# Patient Record
Sex: Male | Born: 1970 | Race: Black or African American | Hispanic: No | Marital: Single | Smoking: Current every day smoker
Health system: Southern US, Community
[De-identification: ages and names within clinical notes are randomized; demographics above are authoritative.]

## PROBLEM LIST (undated history)

## (undated) DIAGNOSIS — E119 Type 2 diabetes mellitus without complications: Secondary | ICD-10-CM

## (undated) DIAGNOSIS — K5792 Diverticulitis of intestine, part unspecified, without perforation or abscess without bleeding: Secondary | ICD-10-CM

## (undated) DIAGNOSIS — A6000 Herpesviral infection of urogenital system, unspecified: Secondary | ICD-10-CM

---

## 2013-02-02 ENCOUNTER — Encounter (HOSPITAL_COMMUNITY): Payer: Self-pay | Admitting: Emergency Medicine

## 2013-02-02 ENCOUNTER — Emergency Department (HOSPITAL_COMMUNITY)
Admission: EM | Admit: 2013-02-02 | Discharge: 2013-02-02 | Disposition: A | Discharge status: Home Health | Payer: Self-pay | Attending: Emergency Medicine | Admitting: Emergency Medicine

## 2013-02-02 ENCOUNTER — Emergency Department (HOSPITAL_COMMUNITY): Payer: Self-pay

## 2013-02-02 DIAGNOSIS — J3489 Other specified disorders of nose and nasal sinuses: Secondary | ICD-10-CM | POA: Insufficient documentation

## 2013-02-02 DIAGNOSIS — R05 Cough: Secondary | ICD-10-CM | POA: Insufficient documentation

## 2013-02-02 DIAGNOSIS — J019 Acute sinusitis, unspecified: Secondary | ICD-10-CM | POA: Insufficient documentation

## 2013-02-02 DIAGNOSIS — F17200 Nicotine dependence, unspecified, uncomplicated: Secondary | ICD-10-CM | POA: Insufficient documentation

## 2013-02-02 DIAGNOSIS — R059 Cough, unspecified: Secondary | ICD-10-CM | POA: Insufficient documentation

## 2013-02-02 MED ORDER — AZITHROMYCIN 250 MG PO TABS: 250.0000 mg | ORAL_TABLET | Freq: Every day | ORAL | Status: DC

## 2013-02-02 MED ORDER — HYDROCODONE-HOMATROPINE 5-1.5 MG/5ML PO SYRP: 5.0000 mL | ORAL_SOLUTION | Freq: Four times a day (QID) | ORAL | Status: DC | PRN

## 2013-02-02 NOTE — ED Notes (Signed)
Pt discharged home with family. NAD.

## 2013-02-02 NOTE — ED Provider Notes (Signed)
History     CSN: [id number]  Arrival date & time 02/02/13  0702   First MD Initiated Contact with Patient 02/02/13 0708      No chief complaint on file.   (Consider location/radiation/quality/duration/timing/severity/associated sxs/prior treatment) HPI Comments: Patient presents today with a chief complaint of productive cough, nasal congestion, sinus pressure, and mild shortness of breath.  He reports that his symptoms have been present for the past 3 weeks and are gradually worsening.  He denies any chest pain.  Denies wheezing.  Denies hemoptysis.  He has not taken his temperature, but does not think that he has been running a fever.  He has not taken anything for symptoms prior to arrival.  He currently smokes 1 cigarette daily.  No history of COPD or Asthma.  Denies prolonged travel or surgeries in the past 4 weeks.  No history of DVT or PE.  No swelling of the lower extremities.    The history is provided by the patient.    No past medical history on file.  No past surgical history on file.  No family history on file.  History  Substance Use Topics  . Smoking status: Not on file  . Smokeless tobacco: Not on file  . Alcohol Use: Not on file      Review of Systems  Constitutional: Negative for fever and chills.  HENT: Positive for congestion, rhinorrhea and sinus pressure.   Respiratory: Positive for cough and shortness of breath.   Skin: Negative for rash.  All other systems reviewed and are negative.    Allergies  Review of patient's allergies indicates not on file.  Home Medications  No current outpatient prescriptions on file.  BP 140/92  Pulse 92  Temp(Src) 99.5 F (37.5 C) (Oral)  Resp 22  SpO2 100%  Physical Exam  Nursing note and vitals reviewed. Constitutional: He appears well-developed and well-nourished. No distress.  HENT:  Head: Normocephalic and atraumatic.  Right Ear: Tympanic membrane and ear canal normal.  Left Ear: Tympanic membrane  and ear canal normal.  Nose: Mucosal edema present. Right sinus exhibits maxillary sinus tenderness. Right sinus exhibits no frontal sinus tenderness. Left sinus exhibits maxillary sinus tenderness. Left sinus exhibits no frontal sinus tenderness.  Mouth/Throat: Uvula is midline, oropharynx is clear and moist and mucous membranes are normal.  Neck: Normal range of motion. Neck supple.  Cardiovascular: Normal rate, regular rhythm and normal heart sounds.   Pulmonary/Chest: Effort normal and breath sounds normal. No respiratory distress. He has no wheezes. He has no rales.  Neurological: He is alert.  Skin: Skin is warm and dry. He is not diaphoretic.  Psychiatric: He has a normal mood and affect.    ED Course  Procedures (including critical care time)  Labs Reviewed - No data to display Dg Chest 2 View  02/02/2013  *RADIOLOGY REPORT*  Clinical Data: Cough, fever and shortness of breath.  CHEST - 2 VIEW  Comparison:  None.  Findings:  The heart size and mediastinal contours are within normal limits.  Both lungs are clear.  The visualized skeletal structures are unremarkable.  IMPRESSION: No active cardiopulmonary disease.   Original Report Authenticated By: Richarda Overlie, M.D.      No diagnosis found.    MDM  Patient presenting with cough, nasal congestion, sinus pressure, and mild SOB that has been present for the past 3 weeks.  CXR negative.  Patient did have sinus pressure to palpation.  Patient discharged home with antibiotic.   No  respiratory distress.  Patient stable for discharge.  Return precautions discussed.        Pascal Lux Ladue, PA-C 02/02/13 2227

## 2013-02-02 NOTE — ED Notes (Signed)
Pt reports shortness of breath for 3 weeks. NAD.

## 2013-02-03 NOTE — ED Provider Notes (Signed)
Medical screening examination/treatment/procedure(s) were performed by non-physician practitioner and as supervising physician I was immediately available for consultation/collaboration.  Gilda Crease, MD 02/03/13 1330

## 2013-07-31 ENCOUNTER — Encounter (HOSPITAL_COMMUNITY): Payer: Self-pay | Admitting: Emergency Medicine

## 2013-07-31 ENCOUNTER — Emergency Department (HOSPITAL_COMMUNITY): Payer: Self-pay

## 2013-07-31 ENCOUNTER — Emergency Department (HOSPITAL_COMMUNITY)
Admission: EM | Admit: 2013-07-31 | Discharge: 2013-07-31 | Disposition: A | Discharge status: Home / Self Care | Payer: Self-pay | Attending: Emergency Medicine | Admitting: Emergency Medicine

## 2013-07-31 DIAGNOSIS — R209 Unspecified disturbances of skin sensation: Secondary | ICD-10-CM | POA: Insufficient documentation

## 2013-07-31 DIAGNOSIS — I10 Essential (primary) hypertension: Secondary | ICD-10-CM | POA: Insufficient documentation

## 2013-07-31 DIAGNOSIS — R51 Headache: Secondary | ICD-10-CM | POA: Insufficient documentation

## 2013-07-31 DIAGNOSIS — F17200 Nicotine dependence, unspecified, uncomplicated: Secondary | ICD-10-CM | POA: Insufficient documentation

## 2013-07-31 DIAGNOSIS — R200 Anesthesia of skin: Secondary | ICD-10-CM

## 2013-07-31 DIAGNOSIS — Z8619 Personal history of other infectious and parasitic diseases: Secondary | ICD-10-CM | POA: Insufficient documentation

## 2013-07-31 HISTORY — DX: Herpesviral infection of urogenital system, unspecified: A60.00

## 2013-07-31 LAB — CBC
MCH: 31.3 pg (ref 26.0–34.0)
MCHC: 36.9 g/dL — ABNORMAL HIGH (ref 30.0–36.0)
Platelets: 239 10*3/uL (ref 150–400)

## 2013-07-31 LAB — COMPREHENSIVE METABOLIC PANEL
ALT: 36 U/L (ref 0–53)
AST: 26 U/L (ref 0–37)
Albumin: 3.9 g/dL (ref 3.5–5.2)
Alkaline Phosphatase: 81 U/L (ref 39–117)
BUN: 14 mg/dL (ref 6–23)
Potassium: 3.9 mEq/L (ref 3.5–5.1)
Sodium: 134 mEq/L — ABNORMAL LOW (ref 135–145)
Total Protein: 7.5 g/dL (ref 6.0–8.3)

## 2013-07-31 LAB — DIFFERENTIAL
Basophils Absolute: 0 10*3/uL (ref 0.0–0.1)
Basophils Relative: 0 % (ref 0–1)
Eosinophils Absolute: 0.1 10*3/uL (ref 0.0–0.7)
Neutrophils Relative %: 61 % (ref 43–77)

## 2013-07-31 LAB — GLUCOSE, CAPILLARY

## 2013-07-31 LAB — PROTIME-INR
INR: 1.01 (ref 0.00–1.49)
Prothrombin Time: 13.1 seconds (ref 11.6–15.2)

## 2013-07-31 LAB — APTT: aPTT: 27 seconds (ref 24–37)

## 2013-07-31 LAB — TROPONIN I: Troponin I: <0.3 ng/mL (ref <0.30)

## 2013-07-31 LAB — POCT I-STAT TROPONIN I: Troponin i, poc: 0 ng/mL (ref 0.00–0.08)

## 2013-07-31 NOTE — ED Notes (Signed)
Reports numbness to left mouth almost gone

## 2013-07-31 NOTE — ED Provider Notes (Signed)
CSN: [id number]     Arrival date & time 07/31/13  1253 History   First MD Initiated Contact with Patient 07/31/13 1314     Chief Complaint  Patient presents with  . Numbness    left upper lip numb   (Consider location/radiation/quality/duration/timing/severity/associated sxs/prior Treatment) HPI Comments: Patient is a 42 year old male past medical history significant for recent herpes diagnosis, HTN presented to the emergency department for left-sided upper lip numbness that began around noon today. Patient states he had a short-lived episode of sharp pain to the left forearm that has resolved and not returned. Patient reports mild generalized headache that began during stay in the emergency department. Patient denies any chest pain, shortness of breath, diaphoresis, nausea, vomiting, numbness or weakness in his extremities, visual disturbance  The history is provided by the patient.    Past Medical History  Diagnosis Date  . Genital herpes   . Hypertension    History reviewed. No pertinent past surgical history. No family history on file. History  Substance Use Topics  . Smoking status: Current Every Day Smoker  . Smokeless tobacco: Not on file  . Alcohol Use: No    Review of Systems  Constitutional: Negative for fever.  HENT: Negative for dental problem, sore throat and trouble swallowing.   Eyes: Negative.   Respiratory: Negative for shortness of breath.   Cardiovascular: Negative for chest pain.  Gastrointestinal: Negative for nausea, vomiting and abdominal pain.  Genitourinary: Negative.   Musculoskeletal: Negative.   Skin: Negative.   Neurological: Negative for weakness.    Allergies  Review of patient's allergies indicates no known allergies.  Home Medications   Current Outpatient Rx  Name  Route  Sig  Dispense  Refill  . fluconazole (DIFLUCAN) 150 MG tablet   Oral   Take 150 mg by mouth See admin instructions. Take 1 tablet on day 1, day 4, and day 7 -  starting on 07/29/13         . valACYclovir (VALTREX) 1000 MG tablet   Oral   Take 1,000 mg by mouth 2 (two) times daily. Started on 07/28/13 for 7 days          BP 132/91  Pulse 85  Temp(Src) 98.1 F (36.7 C) (Oral)  Resp 18  Ht 5' 8.5" (1.74 m)  Wt 230 lb (104.327 kg)  BMI 34.46 kg/m2  SpO2 100% Physical Exam  Constitutional: He is oriented to person, place, and time. He appears well-developed and well-nourished.  HENT:  Head: Normocephalic and atraumatic.  Right Ear: External ear normal.  Left Ear: External ear normal.  Nose: Nose normal.  Mouth/Throat: Oropharynx is clear and moist.  Eyes: Conjunctivae and EOM are normal. Pupils are equal, round, and reactive to light.  Neck: Neck supple.  Cardiovascular: Normal rate, regular rhythm, normal heart sounds and intact distal pulses.   Pulmonary/Chest: Effort normal and breath sounds normal. No respiratory distress.  Abdominal: Soft. Bowel sounds are normal. He exhibits no distension. There is no tenderness. There is no rebound and no guarding.  Musculoskeletal: Normal range of motion. He exhibits no edema.  Neurological: He is alert and oriented to person, place, and time. He has normal strength. No cranial nerve deficit or sensory deficit. He displays a negative Romberg sign. Gait normal. GCS eye subscore is 4. GCS verbal subscore is 5. GCS motor subscore is 6.  No pronator drift. Bilateral heel-knee-shin intact. Dull and sharp sensation intact.   Skin: Skin is warm and dry.  Psychiatric:  He has a normal mood and affect.    ED Course  Procedures (including critical care time) Labs Review Labs Reviewed  CBC - Abnormal; Notable for the following:    WBC 12.4 (*)    MCHC 36.9 (*)    All other components within normal limits  DIFFERENTIAL - Abnormal; Notable for the following:    Lymphs Abs 4.1 (*)    All other components within normal limits  COMPREHENSIVE METABOLIC PANEL - Abnormal; Notable for the following:     Sodium 134 (*)    Glucose, Bld 226 (*)    All other components within normal limits  GLUCOSE, CAPILLARY - Abnormal; Notable for the following:    Glucose-Capillary 216 (*)    All other components within normal limits  PROTIME-INR  APTT  TROPONIN I  POCT I-STAT TROPONIN I   Imaging Review Ct Head (brain) Wo Contrast  07/31/2013   CLINICAL DATA:  42 year old male with headache, left face and lip numbness.  EXAM: CT HEAD WITHOUT CONTRAST  TECHNIQUE: Contiguous axial images were obtained from the base of the skull through the vertex without intravenous contrast.  COMPARISON:  None.  FINDINGS: Visualized paranasal sinuses and mastoids are clear. No acute osseous abnormality identified. Visualized orbits and scalp soft tissues are within normal limits.  Normal cerebral volume. No midline shift, ventriculomegaly, mass effect, evidence of mass lesion, intracranial hemorrhage or evidence of cortically based acute infarction. Gray-white matter differentiation is within normal limits throughout the brain. No suspicious intracranial vascular hyperdensity.  IMPRESSION: Normal noncontrast CT appearance of the brain.   Electronically Signed   By: Augusto Gamble M.D.   On: 07/31/2013 15:03     Date: 07/31/2013  Rate: 93  Rhythm: normal sinus rhythm  QRS Axis: normal  Intervals: normal  ST/T Wave abnormalities: normal  Conduction Disutrbances:none  Narrative Interpretation:   Old EKG Reviewed: none available    MDM   1. Numbness of lip     Afebrile, NAD, non-toxic appearing, AAOx4. No neurofocal deficits on examination. No sensory deficits, sharp and dull sensation intact. I have reviewed nursing notes, vital signs, and all appropriate lab and imaging results for this patient. No concern for intracrnial or cardiac process. CT scan w/o abnormality. EKG w/o abnormality. Labs unremarkable. Symptoms resolved in ED. Return precautions discussed. Patient is agreeable to plan. Patient is stable at time of  discharge. Patient d/w with Dr. Loretha Stapler, agrees with plan.           Jeannetta Ellis, PA-C 08/01/13 1641

## 2013-07-31 NOTE — ED Notes (Signed)
Returned from CT scan.

## 2013-07-31 NOTE — ED Notes (Addendum)
C/o sudden onset at 1200 left corner of mouth numbness which continues. States had sharp pains to left forearm at same time but lasted only few minutes. Denies LUE pain presently.  C/o intermittent h/a x 3 days, presently 6/10. Denies n/v. Pt drove self here & ambulatory to room from triage.  Also reports diagnosed Monday with genital herpes & started on fluconazole & valcyclovir. And was told probably has adult onset diabetes.

## 2013-07-31 NOTE — ED Notes (Signed)
Pt reporting left upper lip is numb starting 45 mins ago, also had pain to left arm. Arm pain has subsided. Denies cp, sob or diaphoresis. Pt is a x 4. Speech is clear and concise. Reports he was seen at [facility] this week and told his blood sugar was high. No acute neuro deficits.

## 2013-08-02 NOTE — ED Provider Notes (Signed)
Medical screening examination/treatment/procedure(s) were conducted as a shared visit with non-physician practitioner(s) and myself.  I personally evaluated the patient during the encounter  42 yo male presenting with CC of left upper lip numbness.  At time of my exam, symptom had resolved.  By my exam and by PA Piepenbrink's exam, he had no sensory deficits (or other neurologic deficits.)  Don't think story is consistent with TIA.  Plan dc home with return precautions and PCP follow up.    Clinical Impression: 1. Numbness of lip       Candyce Churn, MD 08/02/13 [phone]

## 2013-08-02 NOTE — ED Provider Notes (Signed)
Medical screening examination/treatment/procedure(s) were conducted as a shared visit with non-physician practitioner(s) and myself.  I personally evaluated the patient during the encounter.   Please see my separate note.     Candyce Churn, MD 08/02/13 [phone]

## 2013-09-14 ENCOUNTER — Encounter (HOSPITAL_COMMUNITY): Payer: Self-pay | Admitting: Emergency Medicine

## 2013-09-14 DIAGNOSIS — Z79899 Other long term (current) drug therapy: Secondary | ICD-10-CM | POA: Insufficient documentation

## 2013-09-14 DIAGNOSIS — Z8619 Personal history of other infectious and parasitic diseases: Secondary | ICD-10-CM | POA: Insufficient documentation

## 2013-09-14 DIAGNOSIS — Z792 Long term (current) use of antibiotics: Secondary | ICD-10-CM | POA: Insufficient documentation

## 2013-09-14 DIAGNOSIS — E119 Type 2 diabetes mellitus without complications: Secondary | ICD-10-CM | POA: Insufficient documentation

## 2013-09-14 DIAGNOSIS — K5732 Diverticulitis of large intestine without perforation or abscess without bleeding: Secondary | ICD-10-CM | POA: Insufficient documentation

## 2013-09-14 DIAGNOSIS — F17200 Nicotine dependence, unspecified, uncomplicated: Secondary | ICD-10-CM | POA: Insufficient documentation

## 2013-09-14 LAB — POCT I-STAT, CHEM 8
HCT: 47 % (ref 39.0–52.0)
Hemoglobin: 16 g/dL (ref 13.0–17.0)
Sodium: 140 mEq/L (ref 135–145)
TCO2: 28 mmol/L (ref 0–100)

## 2013-09-14 NOTE — ED Notes (Signed)
Patient with history of abdominal pain since Thursday night.  Patient states he also has a pain in his rectum and back pain that went away this morning.  Patient states he does have pain when he urinates.  Patient does have some nausea, no vomiting.

## 2013-09-15 ENCOUNTER — Emergency Department (HOSPITAL_COMMUNITY)
Admission: EM | Admit: 2013-09-15 | Discharge: 2013-09-15 | Disposition: A | Discharge status: Home / Self Care | Payer: Self-pay | Attending: Emergency Medicine | Admitting: Emergency Medicine

## 2013-09-15 ENCOUNTER — Emergency Department (HOSPITAL_COMMUNITY): Payer: Self-pay

## 2013-09-15 ENCOUNTER — Encounter (HOSPITAL_COMMUNITY): Payer: Self-pay | Admitting: Radiology

## 2013-09-15 DIAGNOSIS — K5792 Diverticulitis of intestine, part unspecified, without perforation or abscess without bleeding: Secondary | ICD-10-CM

## 2013-09-15 HISTORY — DX: Type 2 diabetes mellitus without complications: E11.9

## 2013-09-15 LAB — CBC WITH DIFFERENTIAL/PLATELET
Basophils Relative: 0 % (ref 0–1)
Eosinophils Absolute: 0.2 10*3/uL (ref 0.0–0.7)
Lymphs Abs: 4.6 10*3/uL — ABNORMAL HIGH (ref 0.7–4.0)
MCH: 31.1 pg (ref 26.0–34.0)
Neutrophils Relative %: 62 % (ref 43–77)
Platelets: 260 10*3/uL (ref 150–400)
RBC: 5.05 MIL/uL (ref 4.22–5.81)

## 2013-09-15 LAB — URINALYSIS, ROUTINE W REFLEX MICROSCOPIC
Glucose, UA: NEGATIVE mg/dL
Leukocytes, UA: NEGATIVE
Nitrite: NEGATIVE
Protein, ur: NEGATIVE mg/dL
Urobilinogen, UA: 1 mg/dL (ref 0.0–1.0)

## 2013-09-15 MED ORDER — METRONIDAZOLE IN NACL 5-0.79 MG/ML-% IV SOLN
500.0000 mg | Freq: Once | INTRAVENOUS | Status: AC
  Administered 2013-09-15: 500 mg via INTRAVENOUS
  Filled 2013-09-15: qty 100

## 2013-09-15 MED ORDER — CIPROFLOXACIN IN D5W 400 MG/200ML IV SOLN
400.0000 mg | Freq: Once | INTRAVENOUS | Status: AC
  Administered 2013-09-15: 400 mg via INTRAVENOUS
  Filled 2013-09-15: qty 200

## 2013-09-15 MED ORDER — METRONIDAZOLE 500 MG PO TABS: 500.0000 mg | ORAL_TABLET | Freq: Two times a day (BID) | ORAL | Status: DC

## 2013-09-15 MED ORDER — IOHEXOL 300 MG/ML  SOLN: 100.0000 mL | Freq: Once | INTRAMUSCULAR | Status: DC | PRN

## 2013-09-15 MED ORDER — CIPROFLOXACIN HCL 500 MG PO TABS: 500.0000 mg | ORAL_TABLET | Freq: Two times a day (BID) | ORAL | Status: DC

## 2013-09-15 MED ORDER — HYDROCODONE-ACETAMINOPHEN 5-325 MG PO TABS: 1.0000 | ORAL_TABLET | Freq: Four times a day (QID) | ORAL | Status: DC | PRN

## 2013-09-15 MED ORDER — IOHEXOL 300 MG/ML  SOLN
25.0000 mL | Freq: Once | INTRAMUSCULAR | Status: AC | PRN
  Administered 2013-09-15: 25 mL via ORAL

## 2013-09-15 MED ORDER — MORPHINE SULFATE 4 MG/ML IJ SOLN
4.0000 mg | Freq: Once | INTRAMUSCULAR | Status: AC
  Administered 2013-09-15: 4 mg via INTRAVENOUS
  Filled 2013-09-15: qty 1

## 2013-09-15 MED ORDER — PROMETHAZINE HCL 25 MG PO TABS: 25.0000 mg | ORAL_TABLET | Freq: Four times a day (QID) | ORAL | Status: DC | PRN

## 2013-09-15 NOTE — ED Notes (Signed)
MD at bedside. 

## 2013-09-15 NOTE — ED Provider Notes (Signed)
CSN: [id number]     Arrival date & time 09/14/13  2326 History   First MD Initiated Contact with Patient 09/15/13 0329     Chief Complaint  Patient presents with  . Abdominal Pain   (Consider location/radiation/quality/duration/timing/severity/associated sxs/prior Treatment) HPI Comments: 42 y/o male with hx of DM comes in with cc of abd pain. Pt has been having LLQ abd pain, and suprapubic abd pain since Thursday. Pain is constant, with intermittent worsening. No hx of similar pain. No n/v/f/c/diarrhea/bloody stools.  Patient is a 42 y.o. male presenting with abdominal pain. The history is provided by the patient.  Abdominal Pain Associated symptoms: no chest pain, no chills, no cough, no diarrhea, no dysuria, no fever, no hematuria, no nausea, no shortness of breath and no vomiting     Past Medical History  Diagnosis Date  . Genital herpes   . Diabetes mellitus without complication    History reviewed. No pertinent past surgical history. History reviewed. No pertinent family history. History  Substance Use Topics  . Smoking status: Current Some Day Smoker  . Smokeless tobacco: Not on file  . Alcohol Use: No    Review of Systems  Constitutional: Negative for fever, chills, activity change and appetite change.  Respiratory: Negative for cough and shortness of breath.   Cardiovascular: Negative for chest pain.  Gastrointestinal: Positive for abdominal pain. Negative for nausea, vomiting, diarrhea and blood in stool.  Genitourinary: Negative for dysuria and hematuria.    Allergies  Review of patient's allergies indicates no known allergies.  Home Medications   Current Outpatient Rx  Name  Route  Sig  Dispense  Refill  . ibuprofen (ADVIL,MOTRIN) 200 MG tablet   Oral   Take 600 mg by mouth every 6 (six) hours as needed for moderate pain.         . metFORMIN (GLUCOPHAGE) 500 MG tablet   Oral   Take 500 mg by mouth 2 (two) times daily with a meal.         .  ciprofloxacin (CIPRO) 500 MG tablet   Oral   Take 1 tablet (500 mg total) by mouth 2 (two) times daily.   20 tablet   0   . HYDROcodone-acetaminophen (NORCO/VICODIN) 5-325 MG per tablet   Oral   Take 1 tablet by mouth every 6 (six) hours as needed.   15 tablet   0   . metroNIDAZOLE (FLAGYL) 500 MG tablet   Oral   Take 1 tablet (500 mg total) by mouth 2 (two) times daily.   20 tablet   0   . promethazine (PHENERGAN) 25 MG tablet   Oral   Take 1 tablet (25 mg total) by mouth every 6 (six) hours as needed for nausea.   30 tablet   0    BP 116/81  Pulse 88  Temp(Src) 97.8 F (36.6 C) (Oral)  Resp 18  Ht 5\' 9"  (1.753 m)  Wt 228 lb 3.2 oz (103.511 kg)  BMI 33.68 kg/m2  SpO2 97% Physical Exam  Nursing note and vitals reviewed. Constitutional: He is oriented to person, place, and time. He appears well-developed.  HENT:  Head: Normocephalic and atraumatic.  Eyes: Conjunctivae and EOM are normal. Pupils are equal, round, and reactive to light.  Neck: Normal range of motion. Neck supple.  Cardiovascular: Normal rate and regular rhythm.   Pulmonary/Chest: Effort normal and breath sounds normal.  Abdominal: Soft. Bowel sounds are normal. He exhibits no distension. There is tenderness. There is no  rebound and no guarding.  LLQ tenderness - with guarding.  Neurological: He is alert and oriented to person, place, and time.  Skin: Skin is warm.    ED Course  Procedures (including critical care time) Labs Review Labs Reviewed  CBC WITH DIFFERENTIAL - Abnormal; Notable for the following:    WBC 16.6 (*)    Neutro Abs 10.4 (*)    Lymphs Abs 4.6 (*)    Monocytes Absolute 1.3 (*)    All other components within normal limits  POCT I-STAT, CHEM 8 - Abnormal; Notable for the following:    Glucose, Bld 129 (*)    All other components within normal limits  URINALYSIS, ROUTINE W REFLEX MICROSCOPIC   Imaging Review Ct Abdomen Pelvis W Contrast  09/15/2013   CLINICAL DATA:   Abdominal pain since Thursday night. Rectal pain and back pain that went away this morning. Pain with urination. Nausea.  EXAM: CT ABDOMEN AND PELVIS WITH CONTRAST  TECHNIQUE: Multidetector CT imaging of the abdomen and pelvis was performed using the standard protocol following bolus administration of intravenous contrast.  CONTRAST:  100 mL Isovue-300  COMPARISON:  None.  FINDINGS: The lung bases are clear.  The liver, spleen, gallbladder, pancreas, adrenal glands, kidneys, abdominal aorta, inferior vena cava, and retroperitoneal lymph nodes are unremarkable. The stomach, small bowel, and colon are not abnormally distended. No free air or free fluid in the abdomen. Small umbilical hernia containing fat.  Pelvis: There is focal thickening of the wall of the sigmoid colon with mild pericolonic inflammatory changes and with colonic diverticulosis. No discrete abscess. The appearance is most likely represent diverticulitis. However, followup after resolution of acute symptoms is recommended to exclude underlying neoplasm. The appendix is normal. There is no free or loculated pelvic fluid. The prostate gland is not enlarged. The bladder wall is not thickened. Normal alignment of the lumbar spine.  IMPRESSION: Diverticulosis of the sigmoid colon with inflammatory changes and focal thickening likely representing diverticulitis. Followup after resolution of acute symptoms is recommended to exclude underlying mass lesion.   Electronically Signed   By: Burman Nieves M.D.   On: 09/15/2013 05:11    EKG Interpretation   None       MDM   1. Diverticulitis    DDx includes: SBO AAA Tumors Colitis Intra abdominal abscess Thrombosis Mesenteric ischemia Diverticulitis Nephrolithiasis Pyelonephritis UTI/Cystitis  Pt comes in with cc of abd pain. LLQ abd pain, with guarding and leukocytosis. No hx of diverticular dz, so CT ordered, which confirms diverticulitis, and doesn't show any complications. Pt is  tolerating po well. Will give ivab in the ED, and send home with oral AB. Return precautions discussed.           Derwood Kaplan, MD 09/15/13 [phone]

## 2013-09-15 NOTE — ED Notes (Signed)
Pt resting and waiting for his antibiotics to finish going in, no needs at this time

## 2013-09-29 ENCOUNTER — Ambulatory Visit: Payer: Self-pay

## 2013-10-07 ENCOUNTER — Ambulatory Visit: Payer: Self-pay | Admitting: Internal Medicine

## 2014-09-03 IMAGING — CT CT HEAD W/O CM
1 series · 16 of 30 positions shown, 20 images · non-contrast
Comparison: None.

CLINICAL DATA: 41-year-old male with headache, left face and lip
numbness.

EXAM:
CT HEAD WITHOUT CONTRAST
TECHNIQUE: Contiguous axial images were obtained from the base of the skull
through the vertex without intravenous contrast.

[Series 2: head 5.0 h30s · axial · 0.44mm/px · z∈[-106,+29]mm · 16 of 30 slices shown, 20 images]
[im 2/30  brain]
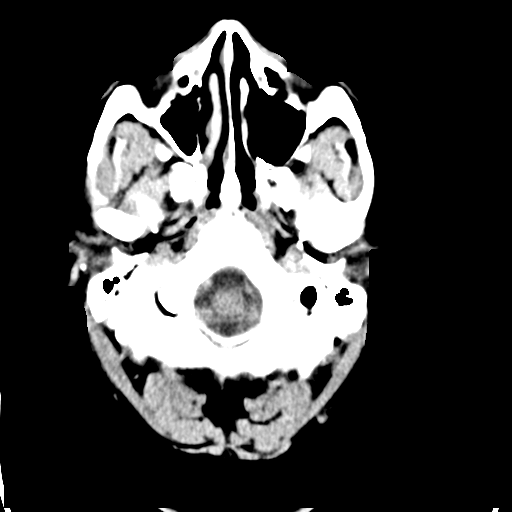
[im 2/30  bone]
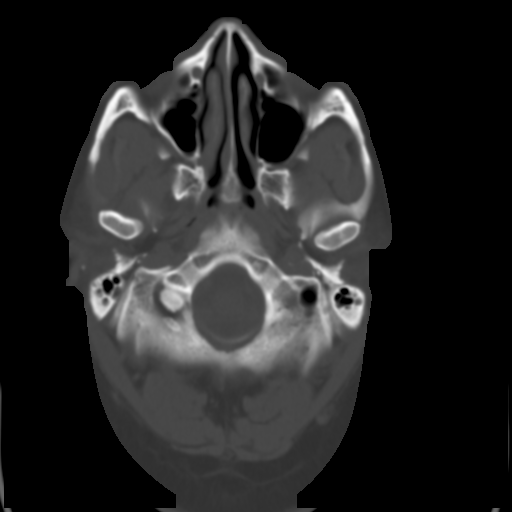
[im 4/30  brain]
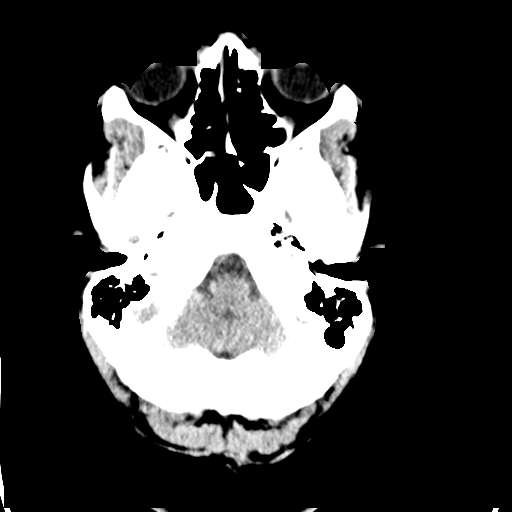
[im 6/30  brain]
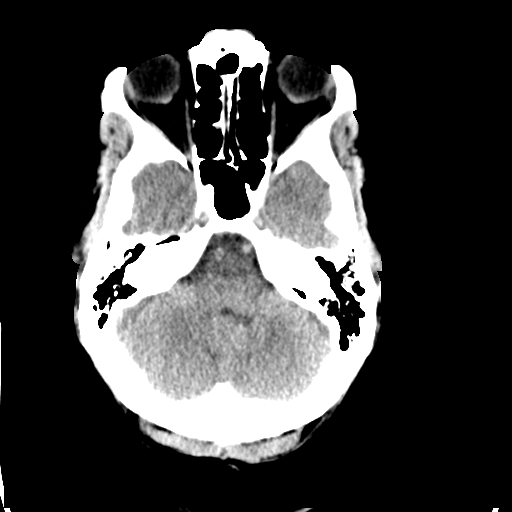
[im 8/30  brain]
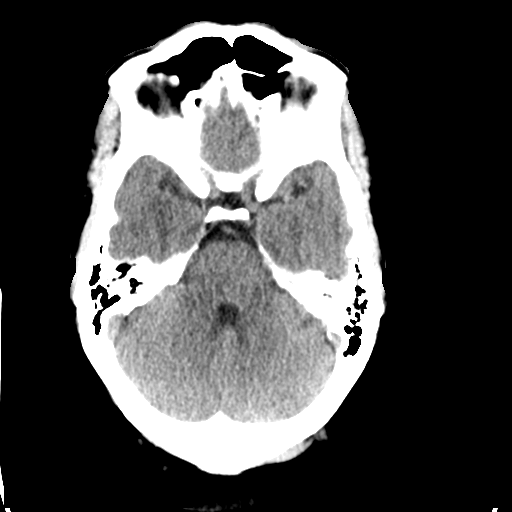
[im 9/30  brain]
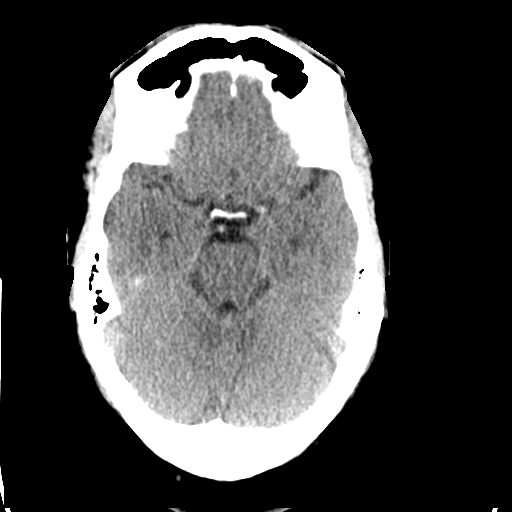
[im 9/30  bone]
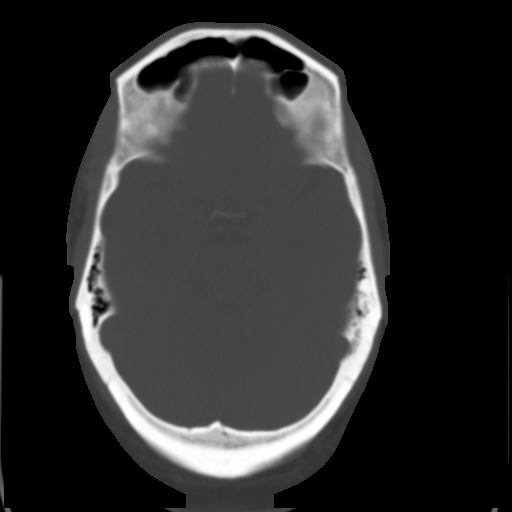
[im 11/30  brain]
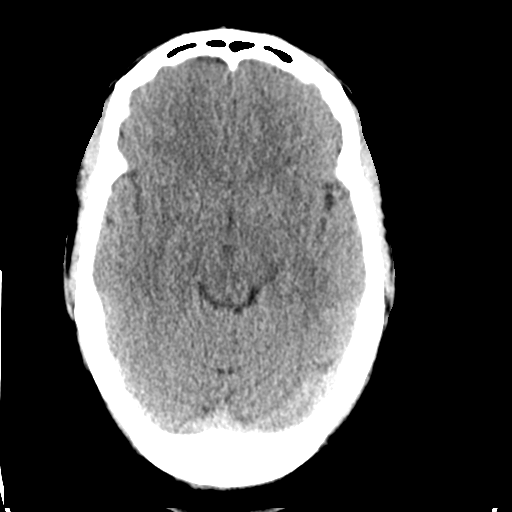
[im 13/30  brain]
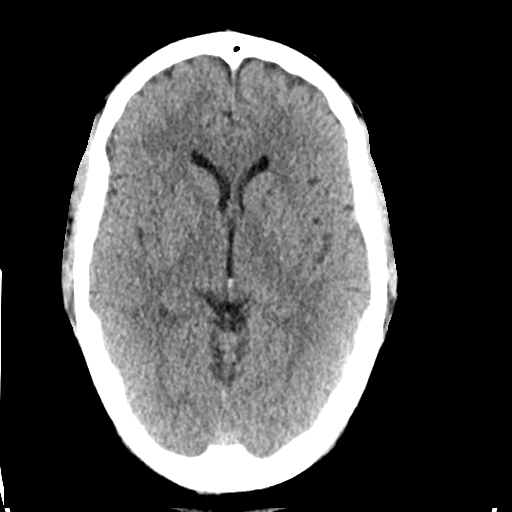
[im 15/30  brain]
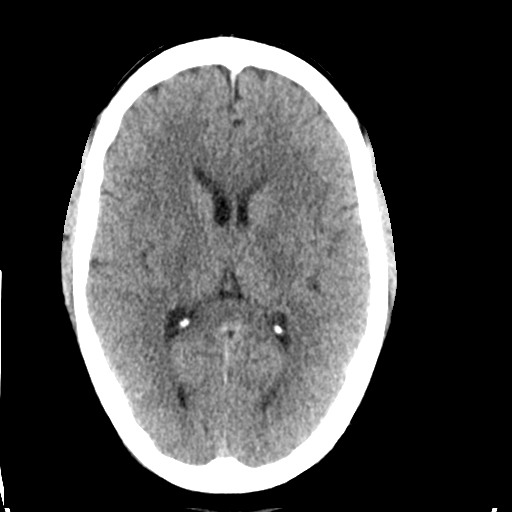
[im 16/30  brain]
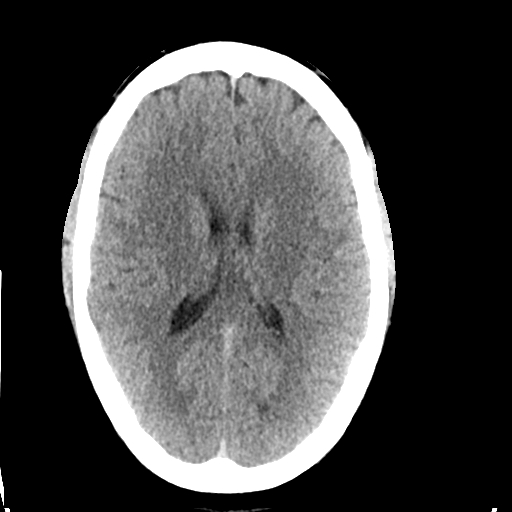
[im 16/30  bone]
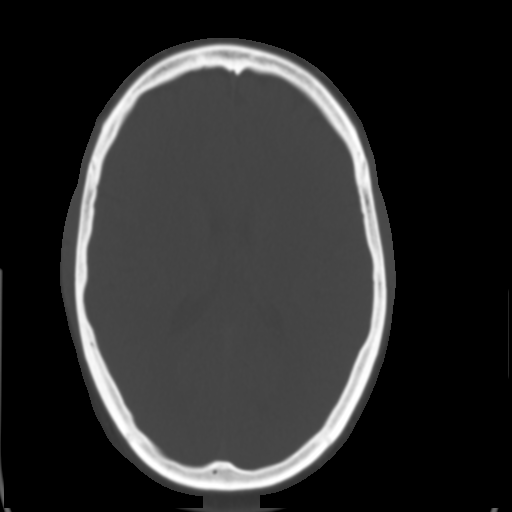
[im 18/30  brain]
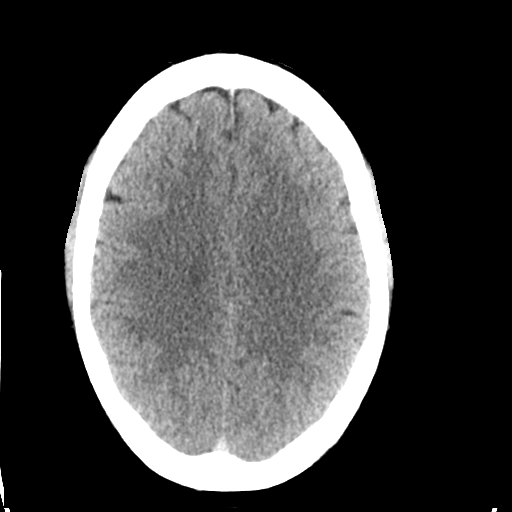
[im 20/30  brain]
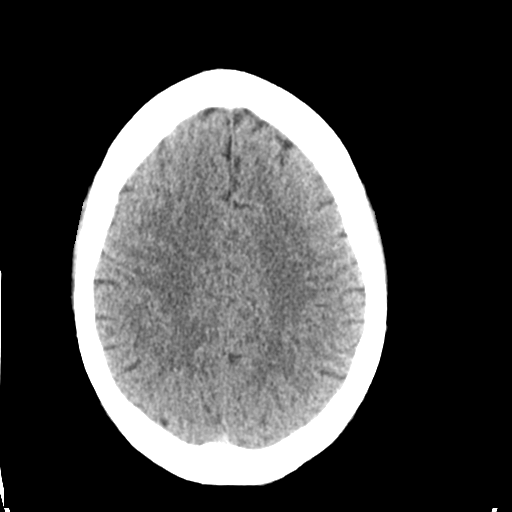
[im 22/30  brain]
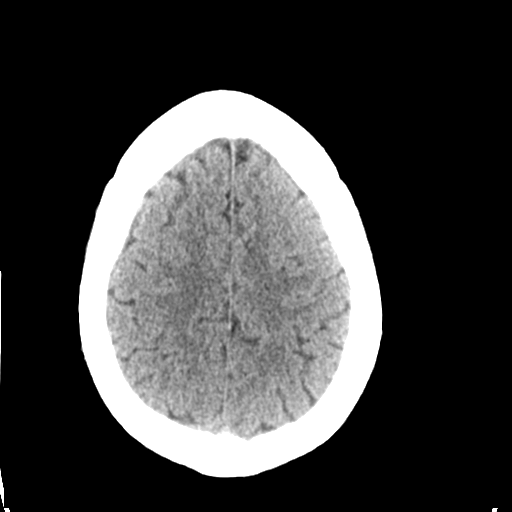
[im 23/30  brain]
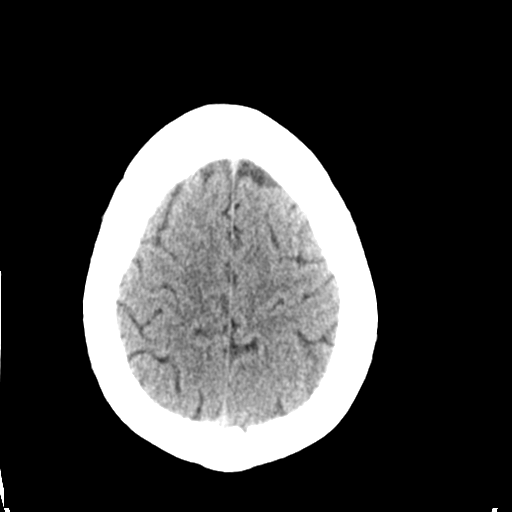
[im 23/30  bone]
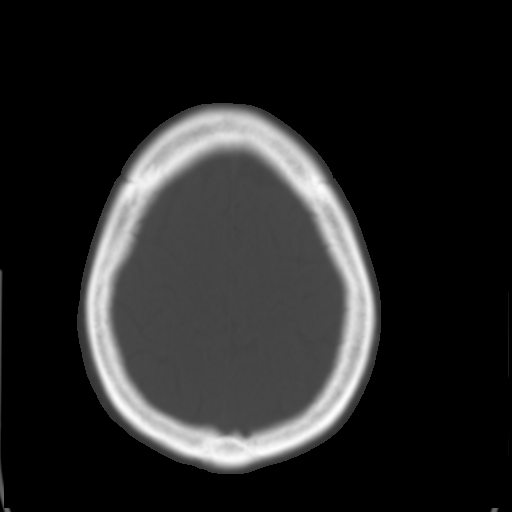
[im 25/30  brain]
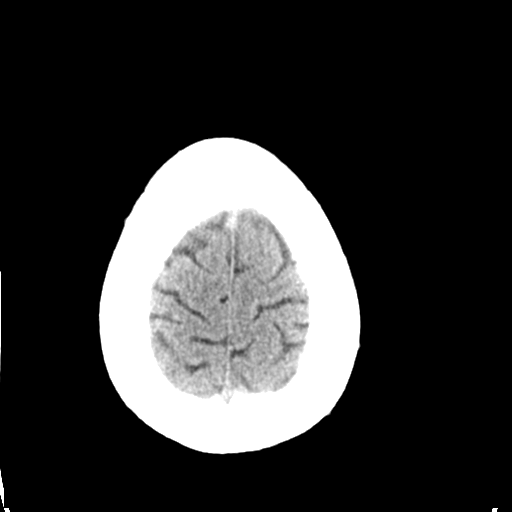
[im 27/30  brain]
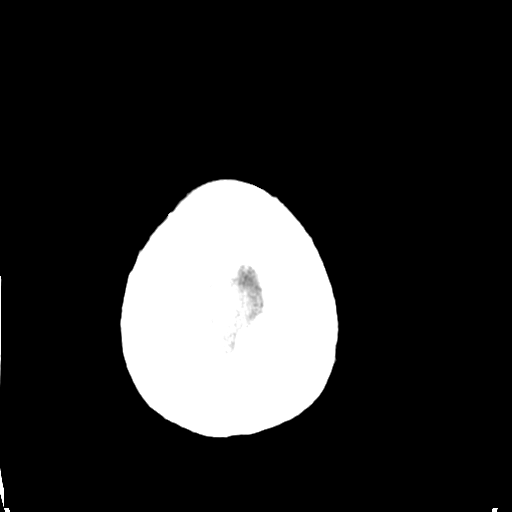
[im 29/30  brain]
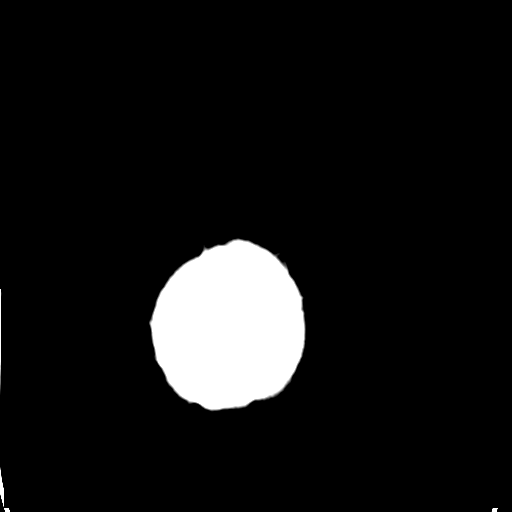

[16 of 30 positions shown; findings below may reference images not displayed]

FINDINGS: Visualized paranasal sinuses and mastoids are clear. No acute
osseous abnormality identified. Visualized orbits and scalp soft
tissues are within normal limits.

Normal cerebral volume. No midline shift, ventriculomegaly, mass
effect, evidence of mass lesion, intracranial hemorrhage or evidence
of cortically based acute infarction. Gray-white matter
differentiation is within normal limits throughout the brain. No
suspicious intracranial vascular hyperdensity.
IMPRESSION: Normal noncontrast CT appearance of the brain.

## 2014-12-09 ENCOUNTER — Encounter (HOSPITAL_COMMUNITY): Payer: Self-pay | Admitting: *Deleted

## 2014-12-09 DIAGNOSIS — E1165 Type 2 diabetes mellitus with hyperglycemia: Secondary | ICD-10-CM | POA: Insufficient documentation

## 2014-12-09 DIAGNOSIS — Z8619 Personal history of other infectious and parasitic diseases: Secondary | ICD-10-CM | POA: Insufficient documentation

## 2014-12-09 DIAGNOSIS — Z720 Tobacco use: Secondary | ICD-10-CM | POA: Insufficient documentation

## 2014-12-09 DIAGNOSIS — Z792 Long term (current) use of antibiotics: Secondary | ICD-10-CM | POA: Insufficient documentation

## 2014-12-09 DIAGNOSIS — G245 Blepharospasm: Secondary | ICD-10-CM | POA: Insufficient documentation

## 2014-12-09 DIAGNOSIS — Z79899 Other long term (current) drug therapy: Secondary | ICD-10-CM | POA: Insufficient documentation

## 2014-12-09 DIAGNOSIS — G459 Transient cerebral ischemic attack, unspecified: Secondary | ICD-10-CM | POA: Insufficient documentation

## 2014-12-09 NOTE — ED Notes (Signed)
The pt  Stumbled while in [employer] 2 hours ago and he feels,like his vision in his lt eye was blurred alos 2 hours ago no  Symptoms at present

## 2014-12-10 ENCOUNTER — Emergency Department (HOSPITAL_COMMUNITY): Payer: Self-pay

## 2014-12-10 ENCOUNTER — Emergency Department (HOSPITAL_COMMUNITY)
Admission: EM | Admit: 2014-12-10 | Discharge: 2014-12-10 | Disposition: A | Discharge status: Home / Self Care | Payer: Self-pay | Attending: Emergency Medicine | Admitting: Emergency Medicine

## 2014-12-10 DIAGNOSIS — G245 Blepharospasm: Secondary | ICD-10-CM

## 2014-12-10 DIAGNOSIS — G458 Other transient cerebral ischemic attacks and related syndromes: Secondary | ICD-10-CM

## 2014-12-10 DIAGNOSIS — R739 Hyperglycemia, unspecified: Secondary | ICD-10-CM

## 2014-12-10 LAB — CBC WITH DIFFERENTIAL/PLATELET
BASOS PCT: 0 % (ref 0–1)
Basophils Absolute: 0.1 10*3/uL (ref 0.0–0.1)
EOS ABS: 0.3 10*3/uL (ref 0.0–0.7)
Eosinophils Relative: 2 % (ref 0–5)
HCT: 45 % (ref 39.0–52.0)
Hemoglobin: 15.5 g/dL (ref 13.0–17.0)
Lymphocytes Relative: 39 % (ref 12–46)
Lymphs Abs: 4.4 10*3/uL — ABNORMAL HIGH (ref 0.7–4.0)
MCH: 29.8 pg (ref 26.0–34.0)
MCHC: 34.4 g/dL (ref 30.0–36.0)
MCV: 86.5 fL (ref 78.0–100.0)
Monocytes Absolute: 0.9 10*3/uL (ref 0.1–1.0)
Monocytes Relative: 8 % (ref 3–12)
NEUTROS ABS: 5.8 10*3/uL (ref 1.7–7.7)
Neutrophils Relative %: 51 % (ref 43–77)
Platelets: 224 10*3/uL (ref 150–400)
RBC: 5.2 MIL/uL (ref 4.22–5.81)
RDW: 12.9 % (ref 11.5–15.5)
WBC: 11.4 10*3/uL — ABNORMAL HIGH (ref 4.0–10.5)

## 2014-12-10 LAB — COMPREHENSIVE METABOLIC PANEL
ALK PHOS: 63 U/L (ref 39–117)
ALT: 26 U/L (ref 0–53)
AST: 18 U/L (ref 0–37)
Albumin: 3.7 g/dL (ref 3.5–5.2)
Anion gap: 3 — ABNORMAL LOW (ref 5–15)
BUN: 12 mg/dL (ref 6–23)
CALCIUM: 8.8 mg/dL (ref 8.4–10.5)
CO2: 29 mmol/L (ref 19–32)
CREATININE: 1.14 mg/dL (ref 0.50–1.35)
Chloride: 103 mmol/L (ref 96–112)
GFR calc non Af Amer: 76 mL/min — ABNORMAL LOW (ref >90)
GFR, EST AFRICAN AMERICAN: 88 mL/min — AB (ref >90)
GLUCOSE: 163 mg/dL — AB (ref 70–99)
POTASSIUM: 3.9 mmol/L (ref 3.5–5.1)
Sodium: 135 mmol/L (ref 135–145)
TOTAL PROTEIN: 6.9 g/dL (ref 6.0–8.3)
Total Bilirubin: 0.4 mg/dL (ref 0.3–1.2)

## 2014-12-10 MED ORDER — ASPIRIN 81 MG PO CHEW: 81.0000 mg | CHEWABLE_TABLET | Freq: Every day | ORAL | Status: DC

## 2014-12-10 NOTE — ED Notes (Signed)
MD at bedside. 

## 2014-12-10 NOTE — Discharge Instructions (Signed)
Please call your doctor for a followup appointment within 24-48 hours. When you talk to your doctor please let them know that you were seen in the emergency department and have them acquire all of your records so that they can discuss the findings with you and formulate a treatment plan to fully care for your new and ongoing problems. ° °RESOURCE GUIDE ° °Chronic Pain Problems: °Contact [facility]  [phone] °Patients need to be referred by their primary care doctor. ° °Insufficient Money for Medicine: °Contact [employer]:  call "211."  ° °No Primary Care Doctor: °- Call [facility]  [phone] - can help you locate a primary care doctor that  accepts your insurance, provides certain services, etc. °- Physician Referral Service- 1-800-533-3463 ° °Agencies that provide inexpensive medical care: °- [facility]  [phone] °- [facility]  [phone] °- [facility]  [phone] °- [facility]  [phone] °- [facility]  [phone] °- [facility]  [phone] ° °Medicaid-accepting [place] Providers: °- [facility]- [address] ° [phone], Mon-Fri 9am-7pm, Sat 9am-1pm °- [facility]- [address], [address] ° [phone] °- [facility]- [address], [address] ° [phone] °- [facility]- [address] [place] [address] ° [phone] °- [facility]- [address], [phone] ° Only accepts [place] Medicaid patients after they have their name  applied to their card ° °Self Pay (no insurance) in [place]: °- Sickle Cell Patients: Dr Eric Dean, [facility] ° [address], [phone] °- [facility]- [address] ° [phone] °      -     [facility]- [address] °      -     [facility]- see information above (Speak to Pam H if you do not have  insurance) °      -  [facility] [place]- [address],  [phone] °      -  [facility]- [address] [place] [address], [phone] °      -  Dr Osei-Bonsu-  [address], [place], [phone] °      -  [facility] - [address], [phone] °      -  [facility]- [address] [place] [address], [phone], also [address]  [address], [phone] °      -    [facility]- [address], [phone], 1st & 3rd Saturday °       every month, 10am-1pm ° °[facility] °[address] °[place], [place] [address] °([phone] ° °[facility] °[address] °[place],  [address] °([phone]) [phone] ° °1) Find a Doctor and Pay Out of Pocket °Although you won't have to find out who is covered by your insurance plan, it is a good idea to ask around and get recommendations. You will then need to call the office and see if the doctor you have chosen will accept you as a new patient and what types of options they offer for patients who are self-pay. Some doctors offer discounts or will set up payment plans for their patients who do not have insurance, but you will need to ask so you aren't surprised when you   get to your appointment. ° °2) Contact Your Local Health Department °Not all health departments have doctors that can see patients for sick visits, but many do, so it is worth a call to see if yours does. If you don't know where your local health department is, you can check in your phone book. The CDC also has a tool to help you locate your state's health department, and many state websites also have listings of all of their local health departments. ° °3) Find a Walk-in Clinic °If your illness is not likely to be very severe or complicated, you may want to try a walk in clinic. These are popping up all over the country in pharmacies, drugstores, and shopping centers. They're usually staffed by nurse practitioners or physician  assistants that have been trained to treat common illnesses and complaints. They're usually fairly quick and inexpensive. However, if you have serious medical issues or chronic medical problems, these are probably not your best option ° °STD Testing °- [place] [facility] [place], [facility], [address], [place], phone [phone] or [phone].  Monday - Friday, call for an appointment. °- [place] [facility] [place], [facility], [address], [place], phone [phone] or [phone].  Monday - Friday, call for an appointment. ° °Abuse/Neglect: °- [place] Child Abuse Hotline (336) 641-3795 °- [place] Child Abuse Hotline 800-378-5315 (After Hours) ° °Emergency Shelter:  [facility] (336) 271-5985 ° °Maternity Homes: °- [facility] (336) 275-9566 °- [facility] (704) 372-4663 ° °MRSA Hotline #:   832-7006 ° °Dental Assistance °If unable to pay or uninsured, contact:  [place] [facility] to become qualified for the adult dental clinic. ° °Patients with Medicaid: [facility] °[address], [phone] °[address], [phone] ° °If unable to pay, or uninsured, contact [place] [facility] ([phone] in [place], [phone] in [place]) to become qualified for the adult dental clinic ° °[facility] °[address] °[place], [place] [address] °(336) 272-4177 °[email or web address] ° °Other Low-Cost Community Dental Services: °- [facility]- [address], [place], [place], [address], [phone], Ext. 123, 2nd and 4th Thursday of the month at 6:30am.  10 clients each day by appointment, can sometimes see walk-in patients if someone does not show for an appointment. °- [facility]- [address], [place], [place], [address], [phone] °- [facility]- [address], [place], [place],  [address], [phone] °- [facility]- [phone] °- [facility]- [phone] °- [facility]- [phone] °-  °

## 2014-12-10 NOTE — ED Provider Notes (Signed)
CSN: [id number]     Arrival date & time 12/09/14  2304 History  This chart was scribed for Thomas Roller, MD by Bronson Curb, ED Scribe. This patient was seen in room B15C/B15C and the patient's care was started at 1:45 AM.   Chief Complaint  Patient presents with  . stumbled      The history is provided by the patient. No language interpreter was used.     HPI Comments: Thomas Trevino is a 44 y.o. male, with history of DM, who presents to the Emergency Department complaining of sudden loss of balance/coordination with associated blurred vision and blepharospasm of the left eye that began approximately 2 hours ago. Patient states he was at [facility] during onset of symptoms, but denies carrying groceries or pushing shopping cart when he lost his balance and stumbled. He denies falling. Patient states that after stumbling, he proceeded to sit his car and notes the visual disturbances in the left eye lasted for approximately 45 minutes. He was encouraged by his family to come to the ED to be evaluated. Patient reports his symptoms have resolved at this time and has returned to baseline. Patient takes Metformin daily. He is a current some day smoker and states he drinks a cup of coffee every morning. He denies any significant cardiac or respiratory history. He further denies vision changes of the right eye, leg swelling, changes in appetite, fever, chills, nausea, vomiting, or chest pain.    Past Medical History  Diagnosis Date  . Genital herpes   . Diabetes mellitus without complication    History reviewed. No pertinent past surgical history. No family history on file. History  Substance Use Topics  . Smoking status: Current Some Day Smoker  . Smokeless tobacco: Not on file  . Alcohol Use: No    Review of Systems  Eyes: Positive for visual disturbance.  All other systems reviewed and are negative.     Allergies  Review of patient's allergies indicates no known  allergies.  Home Medications   Prior to Admission medications   Medication Sig Start Date End Date Taking? Authorizing Provider  ciprofloxacin (CIPRO) 500 MG tablet Take 1 tablet (500 mg total) by mouth 2 (two) times daily. 09/15/13   Derwood Kaplan, MD  HYDROcodone-acetaminophen (NORCO/VICODIN) 5-325 MG per tablet Take 1 tablet by mouth every 6 (six) hours as needed. 09/15/13   Derwood Kaplan, MD  ibuprofen (ADVIL,MOTRIN) 200 MG tablet Take 600 mg by mouth every 6 (six) hours as needed for moderate pain.    Historical Provider, MD  metFORMIN (GLUCOPHAGE) 500 MG tablet Take 500 mg by mouth 2 (two) times daily with a meal.    Historical Provider, MD  metroNIDAZOLE (FLAGYL) 500 MG tablet Take 1 tablet (500 mg total) by mouth 2 (two) times daily. 09/15/13   Derwood Kaplan, MD  promethazine (PHENERGAN) 25 MG tablet Take 1 tablet (25 mg total) by mouth every 6 (six) hours as needed for nausea. 09/15/13   Derwood Kaplan, MD   Triage Vitals: BP 125/83 mmHg  Pulse 72  Temp(Src) 98.7 F (37.1 C) (Oral)  Resp 18  Ht  (1.727 m)  Wt 222 lb (100.699 kg)  BMI 33.76 kg/m2  SpO2 100%  Physical Exam  Constitutional: He appears well-developed and well-nourished. No distress.  HENT:  Head: Normocephalic and atraumatic.  Mouth/Throat: Oropharynx is clear and moist. No oropharyngeal exudate.  Eyes: Conjunctivae and EOM are normal. Pupils are equal, round, and reactive to light. Right eye exhibits  no discharge. Left eye exhibits no discharge. No scleral icterus.  Neck: Normal range of motion. Neck supple. No JVD present. No thyromegaly present.  Cardiovascular: Normal rate, regular rhythm, normal heart sounds and intact distal pulses.  Exam reveals no gallop and no friction rub.   No murmur heard. Pulmonary/Chest: Effort normal and breath sounds normal. No respiratory distress. He has no wheezes. He has no rales.  Abdominal: Soft. Bowel sounds are normal. He exhibits no distension and no mass. There  is no tenderness.  Musculoskeletal: Normal range of motion. He exhibits no edema or tenderness.  Lymphadenopathy:    He has no cervical adenopathy.  Neurological: He is alert. Coordination normal.  Neurologic exam:  Speech clear, pupils equal round reactive to light, extraocular movements intact  Normal peripheral visual fields Cranial nerves III through XII normal including no facial droop Follows commands, moves all extremities x4, normal strength to bilateral upper and lower extremities at all major muscle groups including grip Sensation normal to light touch and pinprick Coordination intact, no limb ataxia, finger-nose-finger normal Rapid alternating movements normal No pronator drift Gait normal   Skin: Skin is warm and dry. No rash noted. No erythema.  Psychiatric: He has a normal mood and affect. His behavior is normal.  Nursing note and vitals reviewed.   ED Course  Procedures (including critical care time)  DIAGNOSTIC STUDIES: Oxygen Saturation is 100% on room air, normal by my interpretation.    COORDINATION OF CARE: At 57 Discussed treatment plan with patient. Patient agrees.   Labs Review Labs Reviewed  CBC WITH DIFFERENTIAL/PLATELET - Abnormal; Notable for the following:    WBC 11.4 (*)    Lymphs Abs 4.4 (*)    All other components within normal limits  COMPREHENSIVE METABOLIC PANEL - Abnormal; Notable for the following:    Glucose, Bld 163 (*)    GFR calc non Af Amer 76 (*)    GFR calc Af Amer 88 (*)    Anion gap 3 (*)    All other components within normal limits    Imaging Review Ct Head Wo Contrast  12/10/2014   CLINICAL DATA:  Blurred vision.  EXAM: CT HEAD WITHOUT CONTRAST  TECHNIQUE: Contiguous axial images were obtained from the base of the skull through the vertex without intravenous contrast.  COMPARISON:  None.  FINDINGS: The brain appears normal without hemorrhage, infarct, mass lesion, mass effect, midline shift or abnormal extra-axial fluid  collection. No hydrocephalus or pneumocephalus. The calvarium is intact. Imaged paranasal sinuses and mastoid air cells are clear.  IMPRESSION: Negative head CT.   Electronically Signed   By: Drusilla Kanner M.D.   On: 12/10/2014 01:34     EKG Interpretation   Date/Time:  Thursday December 10 2014 01:57:37 EST Ventricular Rate:  70 PR Interval:  176 QRS Duration: 94 QT Interval:  383 QTC Calculation: 413 R Axis:   1 Text Interpretation:  Sinus rhythm Left axis deviation ECG OTHERWISE  WITHIN NORMAL LIMITS Since last tracing Rate slower Confirmed by Shona Pardo   MD, Candies Palm ([id number]) on 12/10/2014 4:03:40 AM      MDM   Final diagnoses:  Other specified transient cerebral ischemias  Blepharospasm  Hyperglycemia    The patient has no focal neurologic abnormalities, lab work is overall very unremarkable, CT scan shows no signs of fracture, bleeding, ischemia or mass. The patient has been informed of these findings, he has been symptom free for several hours, he will be discharged home in improved and normal  condition, I advised him to start aspirin daily, he will contact his family doctor to have the rest of the TIA workup initiated.  I personally performed the services described in this documentation, which was scribed in my presence. The recorded information has been reviewed and is accurate.   Thomas RollerBrian D Ameya Vowell, MD 12/10/14 [phone]

## 2015-02-21 ENCOUNTER — Emergency Department (HOSPITAL_COMMUNITY)
Admission: EM | Admit: 2015-02-21 | Discharge: 2015-02-21 | Disposition: A | Discharge status: Home / Self Care | Payer: Self-pay | Attending: Emergency Medicine | Admitting: Emergency Medicine

## 2015-02-21 ENCOUNTER — Encounter (HOSPITAL_COMMUNITY): Payer: Self-pay | Admitting: *Deleted

## 2015-02-21 DIAGNOSIS — Z79899 Other long term (current) drug therapy: Secondary | ICD-10-CM | POA: Insufficient documentation

## 2015-02-21 DIAGNOSIS — L259 Unspecified contact dermatitis, unspecified cause: Secondary | ICD-10-CM | POA: Insufficient documentation

## 2015-02-21 DIAGNOSIS — Z8619 Personal history of other infectious and parasitic diseases: Secondary | ICD-10-CM | POA: Insufficient documentation

## 2015-02-21 DIAGNOSIS — E119 Type 2 diabetes mellitus without complications: Secondary | ICD-10-CM | POA: Insufficient documentation

## 2015-02-21 DIAGNOSIS — Z7982 Long term (current) use of aspirin: Secondary | ICD-10-CM | POA: Insufficient documentation

## 2015-02-21 DIAGNOSIS — Z792 Long term (current) use of antibiotics: Secondary | ICD-10-CM | POA: Insufficient documentation

## 2015-02-21 DIAGNOSIS — Z720 Tobacco use: Secondary | ICD-10-CM | POA: Insufficient documentation

## 2015-02-21 MED ORDER — DIPHENHYDRAMINE HCL 25 MG PO TABS: 25.0000 mg | ORAL_TABLET | Freq: Four times a day (QID) | ORAL | Status: DC

## 2015-02-21 MED ORDER — PREDNISONE 20 MG PO TABS: ORAL_TABLET | ORAL | Status: DC

## 2015-02-21 NOTE — ED Provider Notes (Signed)
CSN: [id number]     Arrival date & time 02/21/15  0250 History   First MD Initiated Contact with Patient 02/21/15 [phone]     Chief Complaint  Patient presents with  . Rash     (Consider location/radiation/quality/duration/timing/severity/associated sxs/prior Treatment) HPI  Pt is a 44yo male with hx of NIDDM, presenting to ED with c/o diffuse pruritic rash on face, hands, and groin that started 2-3 days ago after working in the yard.  Pt reports using calamine lotion as well as hydrocortisone cream with minimal relief.  States rash appears to be spreading. Denies fever, chills, n/v/d. Denies SOB. No difficulty swallowing. Denies difficulty urinating.   Past Medical History  Diagnosis Date  . Genital herpes   . Diabetes mellitus without complication    History reviewed. No pertinent past surgical history. No family history on file. History  Substance Use Topics  . Smoking status: Current Some Day Smoker  . Smokeless tobacco: Not on file  . Alcohol Use: No    Review of Systems  Constitutional: Negative for chills and fatigue.  Respiratory: Negative for cough and shortness of breath.   Cardiovascular: Negative for chest pain.  Gastrointestinal: Negative for nausea and vomiting.  Musculoskeletal: Negative for myalgias, joint swelling and arthralgias.  Skin: Positive for rash. Negative for color change and wound.  All other systems reviewed and are negative.     Allergies  Review of patient's allergies indicates no known allergies.  Home Medications   Prior to Admission medications   Medication Sig Start Date End Date Taking? Authorizing Provider  aspirin 81 MG chewable tablet Chew 1 tablet (81 mg total) by mouth daily. 12/10/14   Eber Hong, MD  ciprofloxacin (CIPRO) 500 MG tablet Take 1 tablet (500 mg total) by mouth 2 (two) times daily. 09/15/13   Derwood Kaplan, MD  diphenhydrAMINE (BENADRYL) 25 MG tablet Take 1 tablet (25 mg total) by mouth every 6 (six) hours. 02/21/15    Junius Finner, PA-C  HYDROcodone-acetaminophen (NORCO/VICODIN) 5-325 MG per tablet Take 1 tablet by mouth every 6 (six) hours as needed. 09/15/13   Derwood Kaplan, MD  ibuprofen (ADVIL,MOTRIN) 200 MG tablet Take 600 mg by mouth every 6 (six) hours as needed for moderate pain.    Historical Provider, MD  metFORMIN (GLUCOPHAGE) 500 MG tablet Take 500 mg by mouth 2 (two) times daily with a meal.    Historical Provider, MD  metroNIDAZOLE (FLAGYL) 500 MG tablet Take 1 tablet (500 mg total) by mouth 2 (two) times daily. 09/15/13   Derwood Kaplan, MD  predniSONE (DELTASONE) 20 MG tablet 2 tabs po daily x 3 days 02/21/15   Junius Finner, PA-C  promethazine (PHENERGAN) 25 MG tablet Take 1 tablet (25 mg total) by mouth every 6 (six) hours as needed for nausea. 09/15/13   Ankit Nanavati, MD   BP 126/86 mmHg  Pulse 75  Temp(Src) 98.3 F (36.8 C) (Oral)  Resp 20  SpO2 99% Physical Exam  Constitutional: He appears well-developed and well-nourished.  HENT:  Head: Normocephalic and atraumatic.  Eyes: Conjunctivae are normal. No scleral icterus.  Neck: Normal range of motion.  Cardiovascular: Normal rate, regular rhythm and normal heart sounds.   Pulmonary/Chest: Effort normal and breath sounds normal. No respiratory distress. He has no wheezes. He has no rales. He exhibits no tenderness.  Abdominal: Soft. Bowel sounds are normal. He exhibits no distension and no mass. There is no tenderness. There is no rebound and no guarding.  Genitourinary: Testes normal. Right testis  shows no mass, no swelling and no tenderness. Left testis shows no mass, no swelling and no tenderness. Uncircumcised. Penile erythema present. No phimosis, paraphimosis, hypospadias or penile tenderness. No discharge found.  Chaperoned exam. Small area of erythematous raised rash to foreskin of penis.   Musculoskeletal: Normal range of motion.  Neurological: He is alert.  Skin: Skin is warm and dry. Rash noted. There is erythema.  Diffuse  erythematous vesicular drying rash on right side of face, both hands and on shaft of penis. No active discharge. No evidence of underlying infection.   Nursing note and vitals reviewed.   ED Course  Procedures (including critical care time) Labs Review Labs Reviewed - No data to display  Imaging Review No results found.   EKG Interpretation None      MDM   Final diagnoses:  Contact dermatitis   Pt presenting to ED with diffuse contact dermatitis, pt believes to be poison ivy.  No evidence of underlying infection. Pt does have hx of NIDDM but states sugar normally is lower than 120.  Will place pt on 3 day course of prednisone as well as benadryl.  Advised pt to monitor his blood sugar closely as prednisone can cause it to elevate. Advised pt he can continue to use hydrocortisone cream on hands but advised to use cautions in genital region and face. F/u with PCP next week for recheck of symptoms if not improving after prednisone. Return precautions provided. Pt verbalized understanding and agreement with tx plan.    Junius FinnerErin O'Malley, PA-C 02/21/15 [id number]  Tomasita CrumbleAdeleke Oni, MD 02/21/15 Paulo [id number]

## 2015-02-21 NOTE — ED Notes (Signed)
Pt. Left with all belongings and refused wheelchair 

## 2015-02-21 NOTE — Discharge Instructions (Signed)
Today you were diagnosed with contact dermatitis, your skin likely reacted to a plant such as poison ivy.  You have been prescribed a 3 day course of prednisone. This is a steroid that can cause your blood sugar to elevate. Be sure to stay well hydrated and to check you blood sugar regularly.  Follow up with your primary care provider next week to ensure rash is healing properly and no signs of infection. See below for further instructions.

## 2015-02-21 NOTE — ED Notes (Signed)
The pt has had a rash over his body since Thursday especially in his groin area.  He thinks he has gotten into poison ivy.  The rash is spreading and getting worse

## 2015-02-27 ENCOUNTER — Encounter (HOSPITAL_COMMUNITY): Payer: Self-pay | Admitting: Emergency Medicine

## 2015-02-27 ENCOUNTER — Emergency Department (HOSPITAL_COMMUNITY)
Admission: EM | Admit: 2015-02-27 | Discharge: 2015-02-27 | Disposition: A | Discharge status: Home / Self Care | Payer: Self-pay | Attending: Emergency Medicine | Admitting: Emergency Medicine

## 2015-02-27 DIAGNOSIS — Z8619 Personal history of other infectious and parasitic diseases: Secondary | ICD-10-CM | POA: Insufficient documentation

## 2015-02-27 DIAGNOSIS — Z7982 Long term (current) use of aspirin: Secondary | ICD-10-CM | POA: Insufficient documentation

## 2015-02-27 DIAGNOSIS — E119 Type 2 diabetes mellitus without complications: Secondary | ICD-10-CM | POA: Insufficient documentation

## 2015-02-27 DIAGNOSIS — Z720 Tobacco use: Secondary | ICD-10-CM | POA: Insufficient documentation

## 2015-02-27 DIAGNOSIS — Z79899 Other long term (current) drug therapy: Secondary | ICD-10-CM | POA: Insufficient documentation

## 2015-02-27 DIAGNOSIS — R21 Rash and other nonspecific skin eruption: Secondary | ICD-10-CM | POA: Insufficient documentation

## 2015-02-27 MED ORDER — PERMETHRIN 5 % EX CREA: TOPICAL_CREAM | CUTANEOUS | Status: DC

## 2015-02-27 MED ORDER — TRIAMCINOLONE ACETONIDE 0.1 % EX CREA: 1.0000 "application " | TOPICAL_CREAM | Freq: Two times a day (BID) | CUTANEOUS | Status: DC

## 2015-02-27 NOTE — ED Notes (Signed)
Pt c/o itchiness and rash on his hand and abdomen, pt states is very uncomfortable and has a bad smell. Pt seen during the last 2-3 weeks and send home with prescriptions but no relieve. Denies fever or chills.

## 2015-02-27 NOTE — Discharge Instructions (Signed)
Use the Permethrin cream.  This will treat Scabies.  If this does not help the rash use the Kenalog cream.

## 2015-02-27 NOTE — ED Provider Notes (Signed)
CSN: [id number]     Arrival date & time 02/27/15  0441 History   First MD Initiated Contact with Patient 02/27/15 [phone]     Chief Complaint  Patient presents with  . Poison Ivy     (Consider location/radiation/quality/duration/timing/severity/associated sxs/prior Treatment) HPI Comments: Patient presents today with a rash.  Rash located on both hands and also the abdomen in the area of the waist.  He reports that the rash has been present for the past 10 days.  He was seen in the ED seven days ago for this rash.  At that time he was placed on a course of Prednisone and also instructed to use Hydrocortisone cream.  He reports that he completed the Prednisone and also used the Hydrocortisone without improvement.  However, he does state that he no longer has the rash in the genital region.  He states that the rash is very pruritic and also burns.  He denies new soaps, detergents, lotions, or medications.  He denies SOB or swelling of the lips, tongue, or throat.  He denies fever, chills, nausea, or vomiting.    Patient is a 44 y.o. male presenting with poison ivy. The history is provided by the patient.  Poison Ivy    Past Medical History  Diagnosis Date  . Genital herpes   . Diabetes mellitus without complication    History reviewed. No pertinent past surgical history. History reviewed. No pertinent family history. History  Substance Use Topics  . Smoking status: Current Some Day Smoker -- 0.50 packs/day    Types: Cigarettes  . Smokeless tobacco: Not on file  . Alcohol Use: No    Review of Systems  All other systems reviewed and are negative.     Allergies  Review of patient's allergies indicates no known allergies.  Home Medications   Prior to Admission medications   Medication Sig Start Date End Date Taking? Authorizing Provider  aspirin 81 MG chewable tablet Chew 1 tablet (81 mg total) by mouth daily. 12/10/14  Yes Eber HongBrian Miller, MD  diphenhydrAMINE (BENADRYL) 25 MG tablet  Take 1 tablet (25 mg total) by mouth every 6 (six) hours. 02/21/15  Yes Junius FinnerErin O'Malley, PA-C  metFORMIN (GLUCOPHAGE) 500 MG tablet Take 500 mg by mouth 2 (two) times daily with a meal.   Yes Historical Provider, MD  ciprofloxacin (CIPRO) 500 MG tablet Take 1 tablet (500 mg total) by mouth 2 (two) times daily. Patient not taking: Reported on 02/27/2015 09/15/13   Derwood KaplanAnkit Nanavati, MD  HYDROcodone-acetaminophen (NORCO/VICODIN) 5-325 MG per tablet Take 1 tablet by mouth every 6 (six) hours as needed. Patient not taking: Reported on 02/27/2015 09/15/13   Derwood KaplanAnkit Nanavati, MD  metroNIDAZOLE (FLAGYL) 500 MG tablet Take 1 tablet (500 mg total) by mouth 2 (two) times daily. Patient not taking: Reported on 02/27/2015 09/15/13   Derwood KaplanAnkit Nanavati, MD  predniSONE (DELTASONE) 20 MG tablet 2 tabs po daily x 3 days Patient not taking: Reported on 02/27/2015 02/21/15   Junius FinnerErin O'Malley, PA-C  promethazine (PHENERGAN) 25 MG tablet Take 1 tablet (25 mg total) by mouth every 6 (six) hours as needed for nausea. Patient not taking: Reported on 02/27/2015 09/15/13   Derwood KaplanAnkit Nanavati, MD   BP 133/86 mmHg  Pulse 79  Temp(Src) 97.7 F (36.5 C) (Oral)  Resp 20  Ht 5\' 8"  (1.727 m)  Wt 220 lb (99.791 kg)  BMI 33.46 kg/m2  SpO2 99% Physical Exam  Constitutional: He appears well-developed and well-nourished.  HENT:  Head: Normocephalic and atraumatic.  Mouth/Throat: Oropharynx is clear and moist.  No swelling of the lips, tongue, or throat.    Neck: Normal range of motion. Neck supple.  Cardiovascular: Normal rate, regular rhythm and normal heart sounds.   Pulmonary/Chest: Effort normal and breath sounds normal.  Neurological: He is alert.  Skin: Skin is warm and dry.  Erythematous scaly papular rash on the dorsal aspect of the right hand and fingers and a small raised area of the left hand.  Papular rash also on the abdomen on the area of the belt bilaterally.  No drainage.    Psychiatric: He has a normal mood and affect.  Nursing note  and vitals reviewed.   ED Course  Procedures (including critical care time) Labs Review Labs Reviewed - No data to display  Imaging Review No results found.   EKG Interpretation None      MDM   Final diagnoses:  None   Patient presents with a complaint of rash.  Rash located on both hands and also the abdomen in the area of the waist.  Appearance most consistent with Eczema.  He reports a history of Eczema.  However, because of the location Scabies is also a possibility.  He reports no improvement in the rash with Hydrocortisone and Prednisone.  Will given Rx for Permethrin.  He is afebrile.  No nausea or vomiting.  Feel that he is stable for discharge.  Return precautions given.      Santiago GladHeather Adael Culbreath, PA-C 02/27/15 2035  Dione Boozeavid Glick, MD 02/28/15 [phone]

## 2015-11-07 ENCOUNTER — Emergency Department (HOSPITAL_COMMUNITY)
Admission: EM | Admit: 2015-11-07 | Discharge: 2015-11-07 | Disposition: A | Discharge status: Home / Self Care | Payer: Self-pay | Attending: Emergency Medicine | Admitting: Emergency Medicine

## 2015-11-07 ENCOUNTER — Encounter (HOSPITAL_COMMUNITY): Payer: Self-pay | Admitting: *Deleted

## 2015-11-07 ENCOUNTER — Emergency Department (HOSPITAL_COMMUNITY): Payer: Self-pay

## 2015-11-07 DIAGNOSIS — Z79899 Other long term (current) drug therapy: Secondary | ICD-10-CM | POA: Insufficient documentation

## 2015-11-07 DIAGNOSIS — Z8619 Personal history of other infectious and parasitic diseases: Secondary | ICD-10-CM | POA: Insufficient documentation

## 2015-11-07 DIAGNOSIS — Z7982 Long term (current) use of aspirin: Secondary | ICD-10-CM | POA: Insufficient documentation

## 2015-11-07 DIAGNOSIS — E119 Type 2 diabetes mellitus without complications: Secondary | ICD-10-CM | POA: Insufficient documentation

## 2015-11-07 DIAGNOSIS — R7611 Nonspecific reaction to tuberculin skin test without active tuberculosis: Secondary | ICD-10-CM | POA: Insufficient documentation

## 2015-11-07 DIAGNOSIS — Z7984 Long term (current) use of oral hypoglycemic drugs: Secondary | ICD-10-CM | POA: Insufficient documentation

## 2015-11-07 DIAGNOSIS — F17210 Nicotine dependence, cigarettes, uncomplicated: Secondary | ICD-10-CM | POA: Insufficient documentation

## 2015-11-07 MED ORDER — METFORMIN HCL 500 MG PO TABS: 500.0000 mg | ORAL_TABLET | Freq: Two times a day (BID) | ORAL | Status: DC

## 2015-11-07 NOTE — ED Notes (Signed)
Pt verbalizes understanding of instructions. 

## 2015-11-07 NOTE — ED Provider Notes (Signed)
CSN: [id number]     Arrival date & time 11/07/15  0654 History   First MD Initiated Contact with Patient 11/07/15 0735     Chief Complaint  Patient presents with  . Abnormal Lab     (Consider location/radiation/quality/duration/timing/severity/associated sxs/prior Treatment) HPI Thomas Trevino is a 45 y.o. male with diabetes, comes in for evaluation of positive PPD. Patient reports he was incarcerated on Tuesday, received a protocol TB test. Test was read the following Thursday and was noted to be positive. Patient reports being bonded on Thursday and did not see another medical provider until today. He denies any fevers, chills, fatigue, cough/hemoptysis, chest pain, shortness of breath, unusual night sweats. He reports [place] moved here from [place] in 1989 and has not traveled out of the country since that time. He has not had a PPD test in the past. No other modifying factors.  Past Medical History  Diagnosis Date  . Genital herpes   . Diabetes mellitus without complication (HCC)    History reviewed. No pertinent past surgical history. No family history on file. Social History  Substance Use Topics  . Smoking status: Current Some Day Smoker -- 0.20 packs/day    Types: Cigarettes  . Smokeless tobacco: None  . Alcohol Use: No    Review of Systems A 10 point review of systems was completed and was negative except for pertinent positives and negatives as mentioned in the history of present illness     Allergies  Review of patient's allergies indicates no known allergies.  Home Medications   Prior to Admission medications   Medication Sig Start Date End Date Taking? Authorizing Provider  aspirin 81 MG chewable tablet Chew 1 tablet (81 mg total) by mouth daily. 12/10/14   Eber HongBrian Miller, MD  ciprofloxacin (CIPRO) 500 MG tablet Take 1 tablet (500 mg total) by mouth 2 (two) times daily. Patient not taking: Reported on 02/27/2015 09/15/13   Derwood KaplanAnkit Nanavati, MD  diphenhydrAMINE (BENADRYL)  25 MG tablet Take 1 tablet (25 mg total) by mouth every 6 (six) hours. 02/21/15   Junius FinnerErin O'Malley, PA-C  HYDROcodone-acetaminophen (NORCO/VICODIN) 5-325 MG per tablet Take 1 tablet by mouth every 6 (six) hours as needed. Patient not taking: Reported on 02/27/2015 09/15/13   Derwood KaplanAnkit Nanavati, MD  metFORMIN (GLUCOPHAGE) 500 MG tablet Take 1 tablet (500 mg total) by mouth 2 (two) times daily with a meal. 11/07/15   Joycie PeekBenjamin Seleni Meller, PA-C  metroNIDAZOLE (FLAGYL) 500 MG tablet Take 1 tablet (500 mg total) by mouth 2 (two) times daily. Patient not taking: Reported on 02/27/2015 09/15/13   Derwood KaplanAnkit Nanavati, MD  permethrin (ELIMITE) 5 % cream Apply to affected area once 02/27/15   Santiago GladHeather Laisure, PA-C  predniSONE (DELTASONE) 20 MG tablet 2 tabs po daily x 3 days Patient not taking: Reported on 02/27/2015 02/21/15   Junius FinnerErin O'Malley, PA-C  promethazine (PHENERGAN) 25 MG tablet Take 1 tablet (25 mg total) by mouth every 6 (six) hours as needed for nausea. Patient not taking: Reported on 02/27/2015 09/15/13   Derwood KaplanAnkit Nanavati, MD  triamcinolone cream (KENALOG) 0.1 % Apply 1 application topically 2 (two) times daily. 02/27/15   Heather Laisure, PA-C   BP 141/91 mmHg  Pulse 91  Temp(Src) 98.1 F (36.7 C) (Oral)  Resp 16  SpO2 97% Physical Exam  Constitutional: He is oriented to person, place, and time. He appears well-developed and well-nourished.  Well appearing male  HENT:  Head: Normocephalic and atraumatic.  Mouth/Throat: Oropharynx is clear and moist.  Eyes: Conjunctivae  are normal. Pupils are equal, round, and reactive to light. Right eye exhibits no discharge. Left eye exhibits no discharge. No scleral icterus.  Neck: Neck supple.  Cardiovascular: Normal rate, regular rhythm and normal heart sounds.   Pulmonary/Chest: Effort normal and breath sounds normal. No respiratory distress. He has no wheezes. He has no rales.  Abdominal: Soft. There is no tenderness.  Musculoskeletal: He exhibits no tenderness.  Neurological:  He is alert and oriented to person, place, and time.  Cranial Nerves II-XII grossly intact  Skin: Skin is warm and dry.  Area of erythema and induration to the volar aspect of right mid forearm approximately 1cm x 1 cm.  Psychiatric: He has a normal mood and affect.  Nursing note and vitals reviewed.   ED Course  Procedures (including critical care time) Labs Review Labs Reviewed - No data to display  Imaging Review Dg Chest 2 View  11/07/2015  CLINICAL DATA:  Positive TB test EXAM: CHEST  2 VIEW COMPARISON:  None. FINDINGS: The heart size and mediastinal contours are within normal limits. Both lungs are clear. The visualized skeletal structures are unremarkable. IMPRESSION: No active cardiopulmonary disease. Electronically Signed   By: Jolaine Click M.D.   On: 11/07/2015 08:29   I have personally reviewed and evaluated these images and lab results as part of my medical decision-making.   EKG Interpretation None     Meds given in ED:  Medications - No data to display  New Prescriptions   No medications on file   Filed Vitals:   11/07/15 0702  BP: 141/91  Pulse: 91  Temp: 98.1 F (36.7 C)  TempSrc: Oral  Resp: 16  SpO2: 97%    MDM  Thomas Trevino is a 45 y.o. male with history of diabetes, recently incarcerated and found to have positive PPD test. Patient is from [place], moved to [place] in 1989 with no travel since. No other symptoms of TB. No HIV or recent travel. PPD test was read within 48-72 hours and was read as positive. Injection site is still erythematous and indurated and roughly 1 cm x 1 cm. Plan to obtain chest x-ray, consult to infectious disease. Discussed with infectious disease, Dr. 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 Chest x-ray shows no acute cardiopulmonary disease. 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 Discussed ED  course and results with patient, need for follow-up with health Department, he verbalizes understanding and agrees with this plan and subsequent discharge. Strict return precautions given. Overall, patient appears well, nontoxic, hemodynamically stable, afebrile and appropriate for outpatient follow-up. 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

## 2015-11-07 NOTE — ED Notes (Signed)
Pt in for TB test reading, pt reports having TB inj x 5 days ago while incarcarated, pt reports being bonded out & since has not had L arm admin site reevaluated, pt appears to have 1 cm x 1 cm induration with redness to the site, pt denies cough & SOB

## 2015-11-07 NOTE — Discharge Instructions (Signed)
You were evaluated in the ED today for your positive PPD test. Your chest x-ray was negative This means that you may have been exposed to tuberculosis in the past, but it is not currently active. It is important for you to follow-up with the health department in order to receive treatment for latent TB. Return to ED for any new or worsening symptoms including fevers, chills, night sweats, cough, coughing up blood, unusual weight loss.

## 2016-01-13 IMAGING — CT CT HEAD W/O CM
1 series · 16 of 30 positions shown, 20 images · non-contrast
Comparison: None.

CLINICAL DATA: Blurred vision.

EXAM:
CT HEAD WITHOUT CONTRAST
TECHNIQUE: Contiguous axial images were obtained from the base of the skull
through the vertex without intravenous contrast.

[Series 2: head 5.0 h30s · axial · 0.43mm/px · z∈[+1013,+1148]mm · 16 of 31 slices shown, 20 images]
[im 2/31  brain]
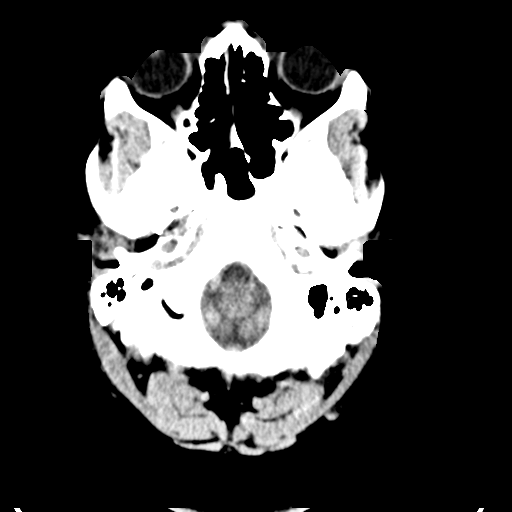
[im 2/31  bone]
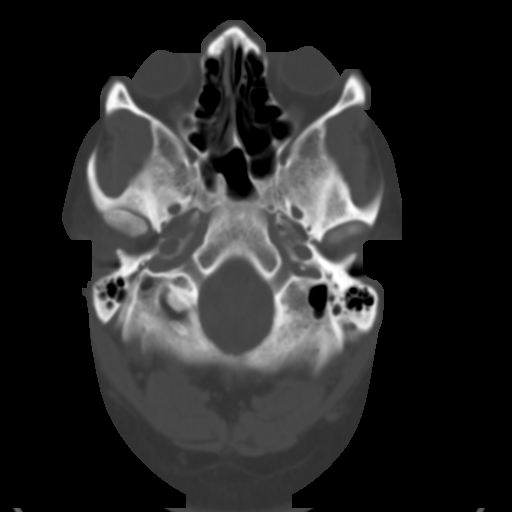
[im 4/31  brain]
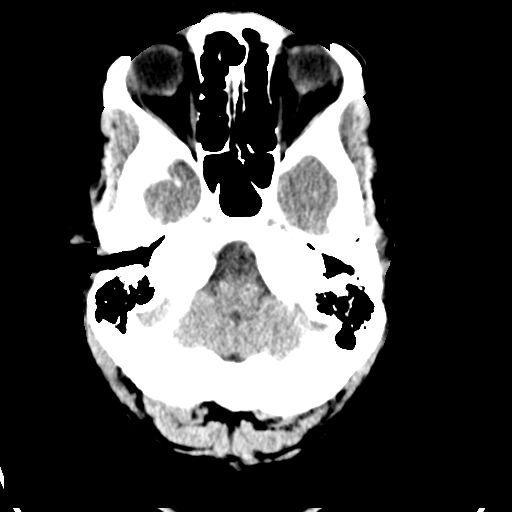
[im 6/31  brain]
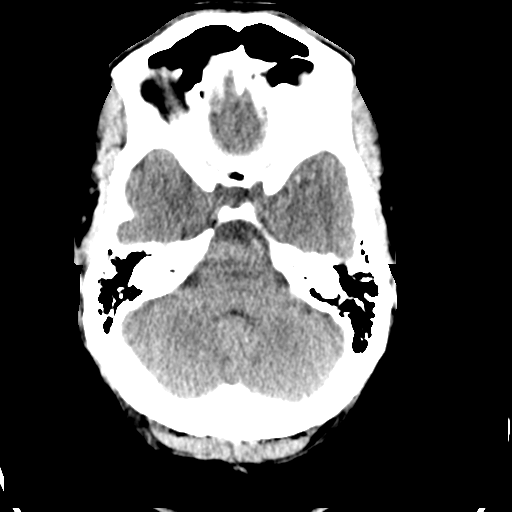
[im 8/31  brain]
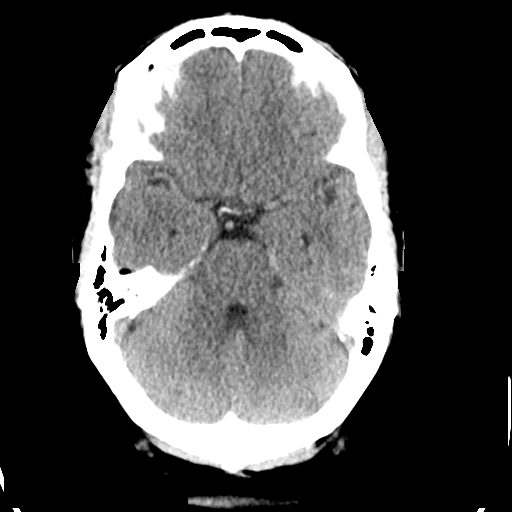
[im 9/31  brain]
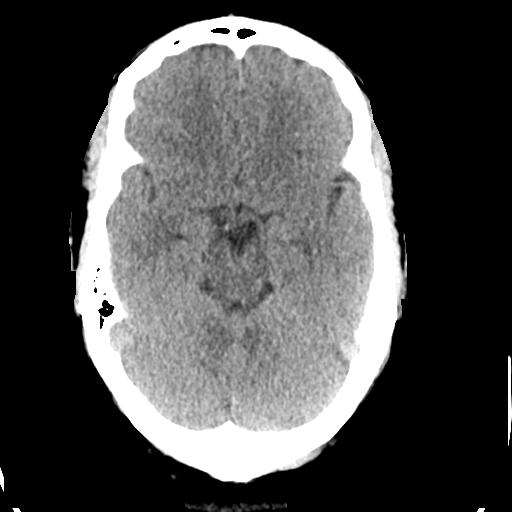
[im 9/31  bone]
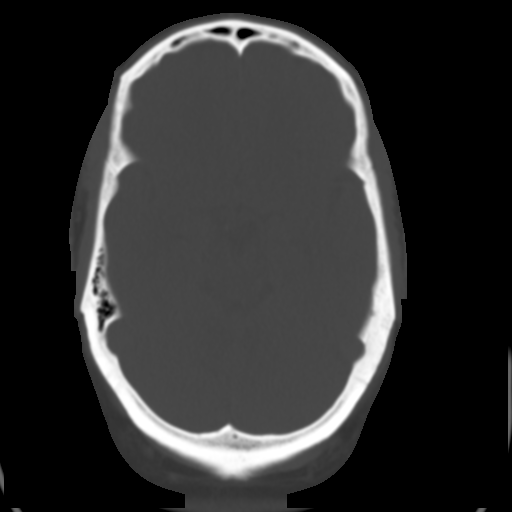
[im 11/31  brain]
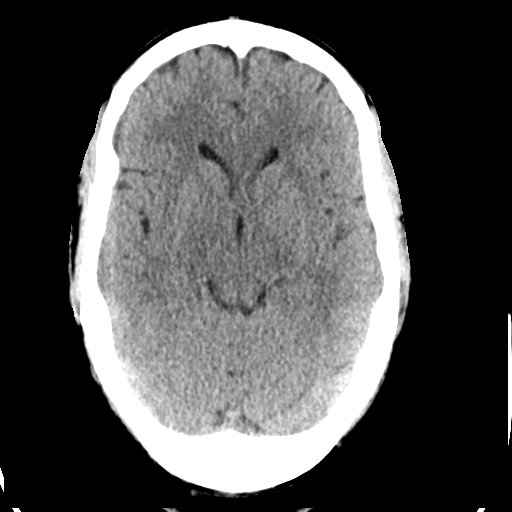
[im 13/31  brain]
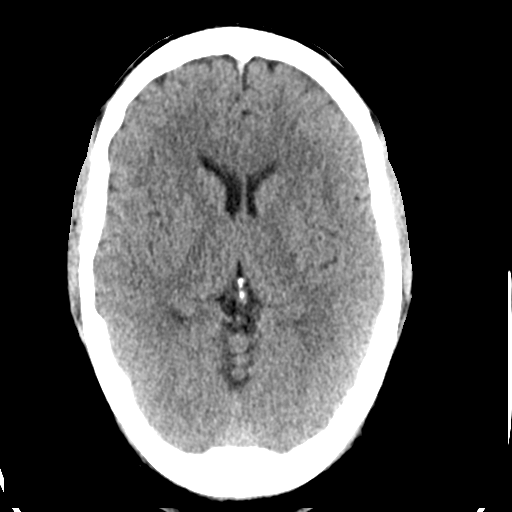
[im 15/31  brain]
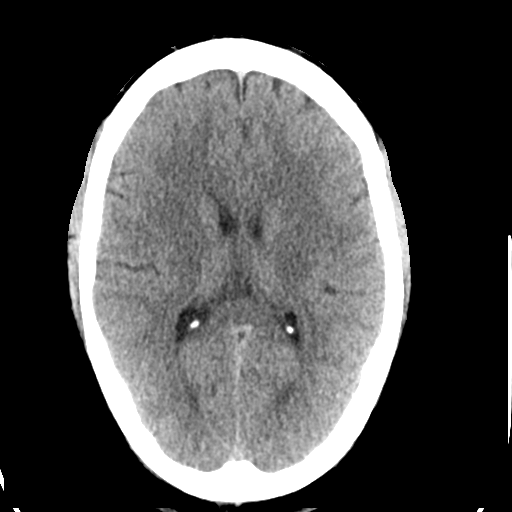
[im 16/31  brain]
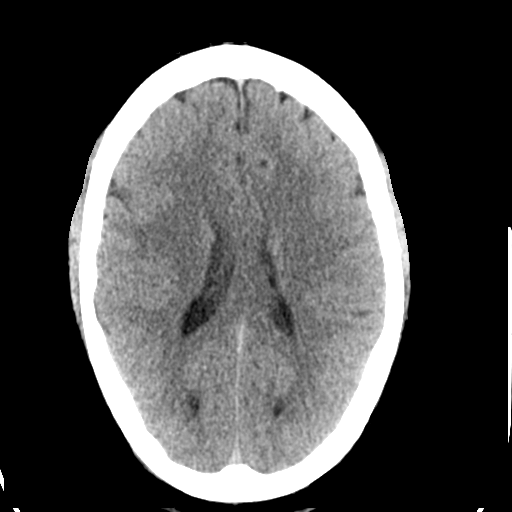
[im 16/31  bone]
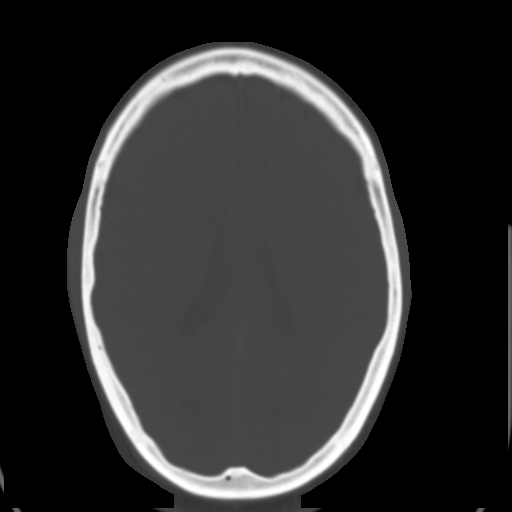
[im 18/31  brain]
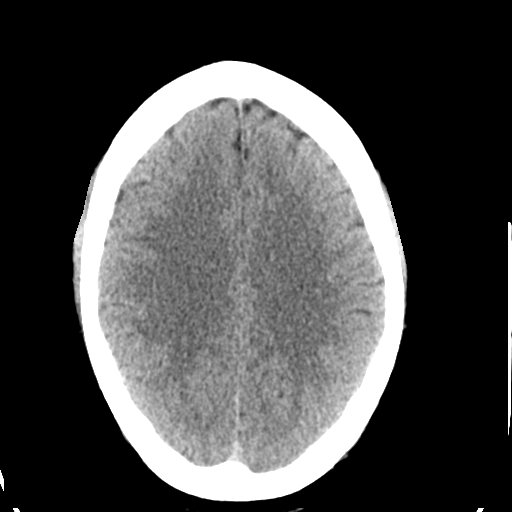
[im 20/31  brain]
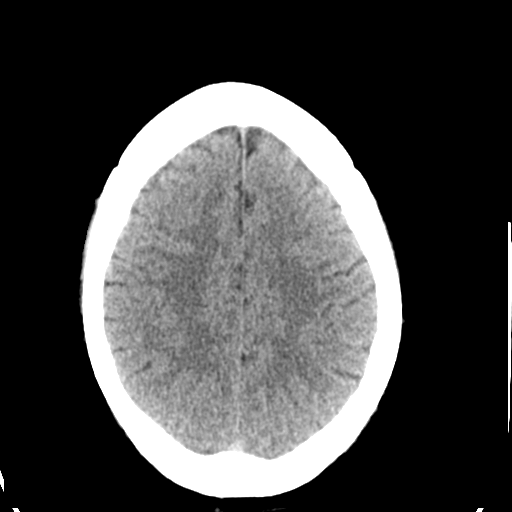
[im 22/31  brain]
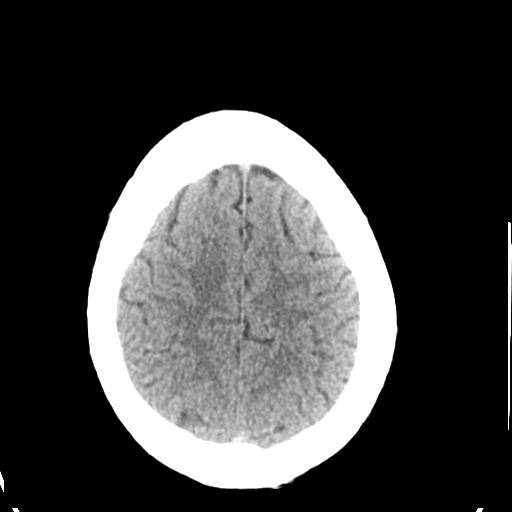
[im 23/31  brain]
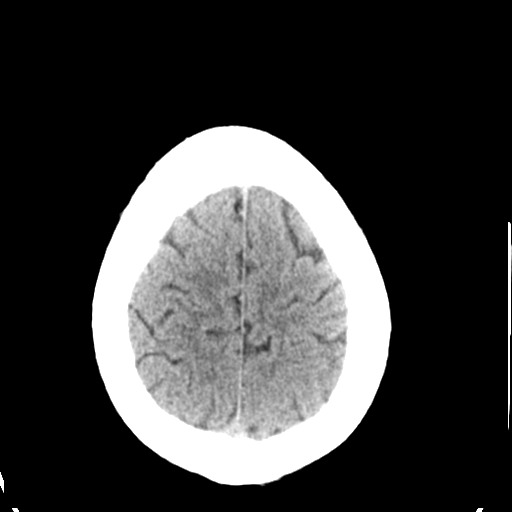
[im 23/31  bone]
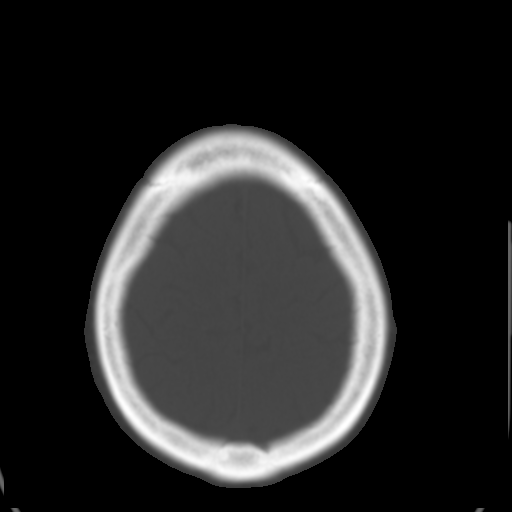
[im 25/31  brain]
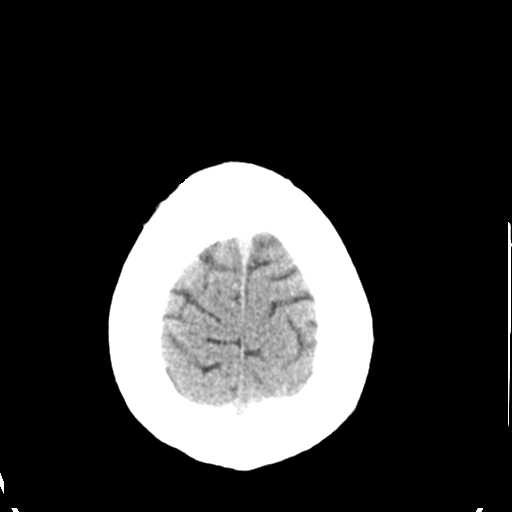
[im 27/31  brain]
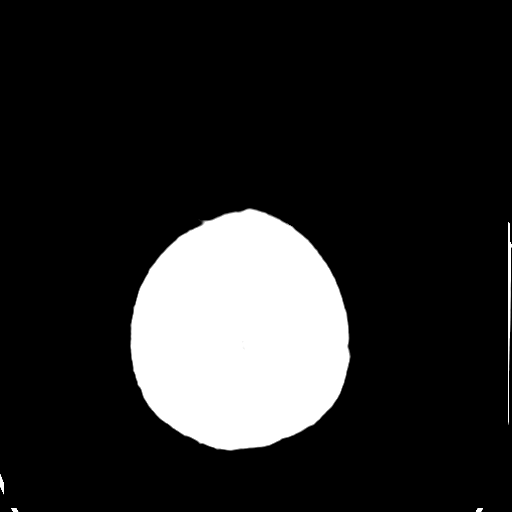
[im 29/31  brain]
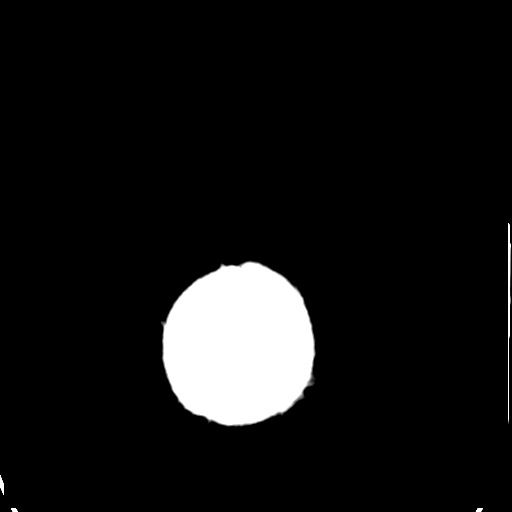

[16 of 30 positions shown; findings below may reference images not displayed]

FINDINGS: The brain appears normal without hemorrhage, infarct, mass lesion,
mass effect, midline shift or abnormal extra-axial fluid collection.
No hydrocephalus or pneumocephalus. The calvarium is intact. Imaged
paranasal sinuses and mastoid air cells are clear.
IMPRESSION: Negative head CT.

## 2016-03-24 ENCOUNTER — Encounter (HOSPITAL_COMMUNITY): Payer: Self-pay

## 2016-03-24 ENCOUNTER — Emergency Department (HOSPITAL_COMMUNITY)
Admission: EM | Admit: 2016-03-24 | Discharge: 2016-03-24 | Disposition: A | Discharge status: Home / Self Care | Payer: Self-pay | Attending: Emergency Medicine | Admitting: Emergency Medicine

## 2016-03-24 DIAGNOSIS — E119 Type 2 diabetes mellitus without complications: Secondary | ICD-10-CM | POA: Insufficient documentation

## 2016-03-24 DIAGNOSIS — R404 Transient alteration of awareness: Secondary | ICD-10-CM

## 2016-03-24 DIAGNOSIS — Z7984 Long term (current) use of oral hypoglycemic drugs: Secondary | ICD-10-CM | POA: Insufficient documentation

## 2016-03-24 DIAGNOSIS — Z8619 Personal history of other infectious and parasitic diseases: Secondary | ICD-10-CM | POA: Insufficient documentation

## 2016-03-24 DIAGNOSIS — Z79899 Other long term (current) drug therapy: Secondary | ICD-10-CM | POA: Insufficient documentation

## 2016-03-24 DIAGNOSIS — F17210 Nicotine dependence, cigarettes, uncomplicated: Secondary | ICD-10-CM | POA: Insufficient documentation

## 2016-03-24 DIAGNOSIS — R4182 Altered mental status, unspecified: Secondary | ICD-10-CM | POA: Insufficient documentation

## 2016-03-24 DIAGNOSIS — Z7952 Long term (current) use of systemic steroids: Secondary | ICD-10-CM | POA: Insufficient documentation

## 2016-03-24 DIAGNOSIS — Z7982 Long term (current) use of aspirin: Secondary | ICD-10-CM | POA: Insufficient documentation

## 2016-03-24 MED ORDER — METFORMIN HCL 500 MG PO TABS: 500.0000 mg | ORAL_TABLET | Freq: Two times a day (BID) | ORAL | Status: DC

## 2016-03-24 NOTE — ED Notes (Addendum)
Pt presents with episode of altered mental status that lasted x 4 hours x 2 weeks ago.  Pt reports running from family, sitting by a pond behind his house claiming to be his brother (who has been dead x 20 years).  Pt reports episode yesterday that he felt "coming on", went home and went to bed with symptoms passing when he awoke.  Pt reports slight headache before each episode.  Pt reports increased stress with wife who drove to [place] to be with another man-was gone x 6 weeks, came back home x 1 month and is now gone again.

## 2016-03-24 NOTE — Discharge Instructions (Signed)
Go to [facility] for optimal care.

## 2016-03-24 NOTE — ED Provider Notes (Signed)
CSN: [id number]     Arrival date & time 03/24/16  0847 History   First MD Initiated Contact with Patient 03/24/16 [phone]     Chief Complaint  Patient presents with  . Altered Mental Status     (Consider location/radiation/quality/duration/timing/severity/associated sxs/prior Treatment) HPI Comments: PT comes in with AMS. Pt had ams 2 weeks ago. Pt reports that his family informed him that pt was acting bizarre 2 weeks ago. Pt was identifying himself as his brother who was dead 20 years ago. Pt's daughter speaks me over the phone, and reports that the same day she called her father, and father wouldn't recognize her over the phone and identified himself Thomas Trevino. Pt's symptoms lasted that entire day, and since then he has not been disoriented.  Thomas Trevino, he started having headache, and he was getting concerned that the symptoms were returning. This morning after dropping his kids, he couldn't remember how to get to his home. Patient ended up coming close to the hospital, so he decided to come here. Pt has no si, hi, denies hallucinations. Denies drug abuse, alcohol abuse. Pt having some domestic issues with his wife, who has left him to be with another person at the moment, they have lost communication.   ROS 10 Systems reviewed and are negative for acute change except as noted in the HPI.     Patient is a 45 y.o. male presenting with altered mental status. The history is provided by the patient.  Altered Mental Status   Past Medical History  Diagnosis Date  . Genital herpes   . Diabetes mellitus without complication (HCC)    History reviewed. No pertinent past surgical history. History reviewed. No pertinent family history. Social History  Substance Use Topics  . Smoking status: Current Some Day Smoker -- 0.20 packs/day    Types: Cigarettes  . Smokeless tobacco: None  . Alcohol Use: No    Review of Systems    Allergies  Review of patient's allergies indicates no known  allergies.  Home Medications   Prior to Admission medications   Medication Sig Start Date End Date Taking? Authorizing Provider  aspirin 81 MG chewable tablet Chew 1 tablet (81 mg total) by mouth daily. 12/10/14   Eber Hong, MD  ciprofloxacin (CIPRO) 500 MG tablet Take 1 tablet (500 mg total) by mouth 2 (two) times daily. Patient not taking: Reported on 02/27/2015 09/15/13   Derwood Kaplan, MD  diphenhydrAMINE (BENADRYL) 25 MG tablet Take 1 tablet (25 mg total) by mouth every 6 (six) hours. 02/21/15   Junius Finner, PA-C  HYDROcodone-acetaminophen (NORCO/VICODIN) 5-325 MG per tablet Take 1 tablet by mouth every 6 (six) hours as needed. Patient not taking: Reported on 02/27/2015 09/15/13   Derwood Kaplan, MD  metFORMIN (GLUCOPHAGE) 500 MG tablet Take 1 tablet (500 mg total) by mouth 2 (two) times daily with a meal. 03/24/16   Derwood Kaplan, MD  metroNIDAZOLE (FLAGYL) 500 MG tablet Take 1 tablet (500 mg total) by mouth 2 (two) times daily. Patient not taking: Reported on 02/27/2015 09/15/13   Derwood Kaplan, MD  permethrin (ELIMITE) 5 % cream Apply to affected area once 02/27/15   Santiago Glad, PA-C  predniSONE (DELTASONE) 20 MG tablet 2 tabs po daily x 3 days Patient not taking: Reported on 02/27/2015 02/21/15   Junius Finner, PA-C  promethazine (PHENERGAN) 25 MG tablet Take 1 tablet (25 mg total) by mouth every 6 (six) hours as needed for nausea. Patient not taking: Reported on 02/27/2015 09/15/13  Derwood KaplanAnkit Wane Mollett, MD  triamcinolone cream (KENALOG) 0.1 % Apply 1 application topically 2 (two) times daily. 02/27/15   Heather Laisure, PA-C   BP 122/89 mmHg  Pulse 78  Temp(Src) 99.1 F (37.3 C) (Oral)  Resp 18  Ht 5\' 8"  (1.727 m)  Wt 210 lb (95.255 kg)  BMI 31.94 kg/m2  SpO2 96% Physical Exam  Constitutional: He is oriented to person, place, and time. He appears well-developed.  HENT:  Head: Atraumatic.  Neck: Neck supple.  Cardiovascular: Normal rate.   Pulmonary/Chest: Effort normal.   Neurological: He is alert and oriented to person, place, and time. No cranial nerve deficit. Coordination normal.  Skin: Skin is warm.  Psychiatric: He has a normal mood and affect. His behavior is normal. Judgment and thought content normal.  Nursing note and vitals reviewed.   ED Course  Procedures (including critical care time) Labs Review Labs Reviewed - No data to display  Imaging Review No results found. I have personally reviewed and evaluated these images and lab results as part of my medical decision-making.   EKG Interpretation None      MDM   Final diagnoses:  Altered sensorium    Pt is ao x 3. He has no SI, HI, Psychoses. He had this episode 2 weeks ago lasting few hours which he cant recall, where he was acting like he was his brother. Even then he had no si, hi, psychoses. No head trauma. Mild headache yday. Neuro exam is normal. We will direct [facility] to [facility] for [facility] evaluation.      Derwood KaplanAnkit Marlei Glomski, MD 03/24/16 1017

## 2017-01-11 ENCOUNTER — Encounter (HOSPITAL_BASED_OUTPATIENT_CLINIC_OR_DEPARTMENT_OTHER): Payer: Self-pay | Admitting: *Deleted

## 2017-01-11 ENCOUNTER — Emergency Department (HOSPITAL_BASED_OUTPATIENT_CLINIC_OR_DEPARTMENT_OTHER): Payer: Self-pay

## 2017-01-11 ENCOUNTER — Emergency Department (HOSPITAL_BASED_OUTPATIENT_CLINIC_OR_DEPARTMENT_OTHER)
Admission: EM | Admit: 2017-01-11 | Discharge: 2017-01-11 | Disposition: A | Discharge status: Home / Self Care | Payer: Self-pay | Attending: Emergency Medicine | Admitting: Emergency Medicine

## 2017-01-11 DIAGNOSIS — E119 Type 2 diabetes mellitus without complications: Secondary | ICD-10-CM | POA: Insufficient documentation

## 2017-01-11 DIAGNOSIS — Z79899 Other long term (current) drug therapy: Secondary | ICD-10-CM | POA: Insufficient documentation

## 2017-01-11 DIAGNOSIS — F17210 Nicotine dependence, cigarettes, uncomplicated: Secondary | ICD-10-CM | POA: Insufficient documentation

## 2017-01-11 DIAGNOSIS — X500XXA Overexertion from strenuous movement or load, initial encounter: Secondary | ICD-10-CM | POA: Insufficient documentation

## 2017-01-11 DIAGNOSIS — T148XXA Other injury of unspecified body region, initial encounter: Secondary | ICD-10-CM

## 2017-01-11 DIAGNOSIS — Z7982 Long term (current) use of aspirin: Secondary | ICD-10-CM | POA: Insufficient documentation

## 2017-01-11 DIAGNOSIS — S46912A Strain of unspecified muscle, fascia and tendon at shoulder and upper arm level, left arm, initial encounter: Secondary | ICD-10-CM | POA: Insufficient documentation

## 2017-01-11 DIAGNOSIS — Y990 Civilian activity done for income or pay: Secondary | ICD-10-CM | POA: Insufficient documentation

## 2017-01-11 DIAGNOSIS — Y9389 Activity, other specified: Secondary | ICD-10-CM | POA: Insufficient documentation

## 2017-01-11 DIAGNOSIS — S46911A Strain of unspecified muscle, fascia and tendon at shoulder and upper arm level, right arm, initial encounter: Secondary | ICD-10-CM | POA: Insufficient documentation

## 2017-01-11 DIAGNOSIS — Y929 Unspecified place or not applicable: Secondary | ICD-10-CM | POA: Insufficient documentation

## 2017-01-11 MED ORDER — METHOCARBAMOL 500 MG PO TABS: 500.0000 mg | ORAL_TABLET | Freq: Two times a day (BID) | ORAL | 0 refills | Status: DC | PRN

## 2017-01-11 MED ORDER — IBUPROFEN 800 MG PO TABS
800.0000 mg | ORAL_TABLET | Freq: Once | ORAL | Status: AC
  Administered 2017-01-11: 800 mg via ORAL
  Filled 2017-01-11: qty 1

## 2017-01-11 MED ORDER — METFORMIN HCL 500 MG PO TABS: 500.0000 mg | ORAL_TABLET | Freq: Two times a day (BID) | ORAL | 1 refills | Status: DC

## 2017-01-11 MED ORDER — NAPROXEN 500 MG PO TABS: 500.0000 mg | ORAL_TABLET | Freq: Two times a day (BID) | ORAL | 0 refills | Status: DC | PRN

## 2017-01-11 MED FILL — NAPROXEN 500 MG TABLET: 500 | 15 days supply | Qty: 30 | Fill #0

## 2017-01-11 MED FILL — metFORMIN HCL 500 MG TABS: 500 | 30 days supply | Qty: 60 | Fill #0

## 2017-01-11 MED FILL — METHOCARBAMOL 500 MG TABLET: 500 | 6 days supply | Qty: 12 | Fill #0

## 2017-01-11 NOTE — ED Provider Notes (Signed)
[facility] Provider Note   CSN: [id number] Arrival date & time: 01/11/17  1047     History   Chief Complaint Chief Complaint  Patient presents with  . Shoulder Pain    HPI Thomas Trevino is a 46 y.o. male.  The history is provided by the patient and medical records. No language interpreter was used.  Shoulder Pain   Pertinent negatives include no numbness.   Thomas Trevino is a 45 y.o. male  with a PMH of DM who presents to the Emergency Department complaining of aching bilateral shoulder pain x 3 weeks which is worse when he lays down or tries to sleep at night. He does do heavy lifting frequently with his job, however denies any known injury or trauma. He reports similar feeling to the right shoulder several years ago where he saw his PCP and was instructed on exercises to do for the pain. He notes that over a few weeks, shoulder pain resolved. He took Motrin a few times in the last 3 weeks with no improvement. No neck pain. No fever and chills. No numbness or tingling.  Past Medical History:  Diagnosis Date  . Diabetes mellitus without complication (HCC)   . Genital herpes     There are no active problems to display for this patient.   History reviewed. No pertinent surgical history.     Home Medications    Prior to Admission medications   Medication Sig Start Date End Date Taking? Authorizing Provider  aspirin 81 MG chewable tablet Chew 1 tablet (81 mg total) by mouth daily. 12/10/14   Eber Hong, MD  ciprofloxacin (CIPRO) 500 MG tablet Take 1 tablet (500 mg total) by mouth 2 (two) times daily. Patient not taking: Reported on 02/27/2015 09/15/13   Derwood Kaplan, MD  diphenhydrAMINE (BENADRYL) 25 MG tablet Take 1 tablet (25 mg total) by mouth every 6 (six) hours. 02/21/15   Junius Finner, PA-C  HYDROcodone-acetaminophen (NORCO/VICODIN) 5-325 MG per tablet Take 1 tablet by mouth every 6 (six) hours as needed. Patient not taking: Reported on 02/27/2015  09/15/13   Derwood Kaplan, MD  metFORMIN (GLUCOPHAGE) 500 MG tablet Take 1 tablet (500 mg total) by mouth 2 (two) times daily with a meal. 01/11/17   Chase Picket Verginia Toohey, PA-C  methocarbamol (ROBAXIN) 500 MG tablet Take 1 tablet (500 mg total) by mouth 2 (two) times daily as needed for muscle spasms. 01/11/17   Chase Picket Waniya Hoglund, PA-C  metroNIDAZOLE (FLAGYL) 500 MG tablet Take 1 tablet (500 mg total) by mouth 2 (two) times daily. Patient not taking: Reported on 02/27/2015 09/15/13   Derwood Kaplan, MD  naproxen (NAPROSYN) 500 MG tablet Take 1 tablet (500 mg total) by mouth 2 (two) times daily as needed. 01/11/17   Chase Picket Charlette Hennings, PA-C  permethrin (ELIMITE) 5 % cream Apply to affected area once 02/27/15   Santiago Glad, PA-C  predniSONE (DELTASONE) 20 MG tablet 2 tabs po daily x 3 days Patient not taking: Reported on 02/27/2015 02/21/15   Junius Finner, PA-C  promethazine (PHENERGAN) 25 MG tablet Take 1 tablet (25 mg total) by mouth every 6 (six) hours as needed for nausea. Patient not taking: Reported on 02/27/2015 09/15/13   Derwood Kaplan, MD  triamcinolone cream (KENALOG) 0.1 % Apply 1 application topically 2 (two) times daily. 02/27/15   Santiago Glad, PA-C    Family History No family history on file.  Social History Social History  Substance Use Topics  . Smoking status: Current Some Day  Smoker    Packs/day: 0.20    Types: Cigarettes  . Smokeless tobacco: Never Used  . Alcohol use No     Allergies   Patient has no known allergies.   Review of Systems Review of Systems  Constitutional: Negative for fever.  Musculoskeletal: Positive for arthralgias and myalgias. Negative for joint swelling and neck pain.  Skin: Negative for wound.  Neurological: Negative for weakness and numbness.     Physical Exam Updated Vital Signs BP 134/83 (BP Location: Right Arm)   Pulse 79   Temp 98.2 F (36.8 C) (Oral)   Resp 18   Ht 5' 8.5" (1.74 m)   Wt 95.3 kg   SpO2 100%   BMI 31.47 kg/m    Physical Exam  Constitutional: He appears well-developed and well-nourished. No distress.  HENT:  Head: Normocephalic and atraumatic.  Neck:  No midline or paraspinal tenderness. Full range of motion without pain.  Cardiovascular: Normal rate, regular rhythm and normal heart sounds.   No murmur heard. Pulmonary/Chest: Effort normal and breath sounds normal. No respiratory distress. He has no wheezes. He has no rales.  Musculoskeletal:  Tenderness to palpation of bilateral trapezius and rotator cuff musculature with no overlying skin changes. No crepitus or deformity appreciated. Patient has full range of motion with negative Neer's and empty can test. Negative lift off. 2+ radial pulses bilaterally. Motor and sensation intact to radial, ulnar and median nerve distributions. Good grip strength and pincer grasp.  Neurological: He is alert.  Skin: Skin is warm and dry.  Nursing note and vitals reviewed.    ED Treatments / Results  Labs (all labs ordered are listed, but only abnormal results are displayed) Labs Reviewed - No data to display  EKG  EKG Interpretation None       Radiology Dg Shoulder Right  Result Date: 01/11/2017 CLINICAL DATA:  Bilateral shoulder pain for 1 month, no known injury EXAM: RIGHT SHOULDER - 2+ VIEW COMPARISON:  None. FINDINGS: Three views of the right shoulder submitted. No acute fracture or subluxation. AC joint and glenohumeral joint are preserved. No radiopaque foreign body. IMPRESSION: Negative. Electronically Signed   By: Natasha MeadLiviu  Pop M.D.   On: 01/11/2017 11:44   Dg Shoulder Left  Result Date: 01/11/2017 CLINICAL DATA:  Shoulder pain EXAM: LEFT SHOULDER - 2+ VIEW COMPARISON:  None. FINDINGS: There is no evidence of fracture or dislocation. There is no evidence of arthropathy or other focal bone abnormality. Soft tissues are unremarkable. IMPRESSION: Negative. Electronically Signed   By: Signa Kellaylor  Stroud M.D.   On: 01/11/2017 11:49     Procedures Procedures (including critical care time)  Medications Ordered in ED Medications  ibuprofen (ADVIL,MOTRIN) tablet 800 mg (800 mg Oral Given 01/11/17 1138)     Initial Impression / Assessment and Plan / ED Course  I have reviewed the triage vital signs and the nursing notes.  Pertinent labs & imaging results that were available during my care of the patient were reviewed by me and considered in my medical decision making (see chart for details).    Quitman LivingsDwight Cumpston is a 46 y.o. male who presents to ED for aching bilateral shoulder pain x 3 weeks which appears musculoskeletal in nature. No cervical tenderness, bilateral upper extremities neurovascularly intact. Full range of motion. Discussed that I believe the pain is musculoskeletal in x-rays will be of limited value, however patient. Much would like imaging. Bilateral shoulder plain films ordered and negative. Symptomatic home care instructions given. Rx for robaxin  and naproxen given. Sports medicine or PCP follow up. Sports med referral given. Patient states he is almost out of his metformin and PCP appointment is not for a few more weeks. Refill provided. Return precautions discussed and all questions answered.    Final Clinical Impressions(s) / ED Diagnoses   Final diagnoses:  Muscle strain    New Prescriptions New Prescriptions   METHOCARBAMOL (ROBAXIN) 500 MG TABLET    Take 1 tablet (500 mg total) by mouth 2 (two) times daily as needed for muscle spasms.   NAPROXEN (NAPROSYN) 500 MG TABLET    Take 1 tablet (500 mg total) by mouth 2 (two) times daily as needed.       [facility] Ameliarose Shark, PA-C 01/11/17 1217    Gwyneth Sprout, MD 01/12/17 2033

## 2017-01-11 NOTE — ED Notes (Signed)
Patient transported to X-ray 

## 2017-01-11 NOTE — ED Triage Notes (Signed)
Pain in both shoulders x 2 weeks. Worse at night when he lays down.

## 2017-01-11 NOTE — Discharge Instructions (Signed)
It was my pleasure taking care of you today!   Naproxen as needed for pain. Robaxin is your muscle relaxer - This can make you very drowsy - please do not drink alcohol, operate heavy machinery or drive on this medication. I have also refilled your Metformin for the month.   If your symptoms do not improve over the next week, please call the sports medicine clinic listed or you may follow up with your primary care physician.  Please return to the ER for new or worsening symptoms, any additional concerns.

## 2017-01-11 NOTE — ED Notes (Signed)
Work note given. Pt directed to pharmacy to pick up prescriptions

## 2017-10-08 ENCOUNTER — Emergency Department (HOSPITAL_BASED_OUTPATIENT_CLINIC_OR_DEPARTMENT_OTHER)
Admission: EM | Admit: 2017-10-08 | Discharge: 2017-10-08 | Disposition: A | Discharge status: Home / Self Care | Payer: 59 | Attending: Emergency Medicine | Admitting: Emergency Medicine

## 2017-10-08 ENCOUNTER — Other Ambulatory Visit: Payer: Self-pay

## 2017-10-08 ENCOUNTER — Encounter (HOSPITAL_BASED_OUTPATIENT_CLINIC_OR_DEPARTMENT_OTHER): Payer: Self-pay | Admitting: *Deleted

## 2017-10-08 DIAGNOSIS — F17210 Nicotine dependence, cigarettes, uncomplicated: Secondary | ICD-10-CM | POA: Insufficient documentation

## 2017-10-08 DIAGNOSIS — Z7982 Long term (current) use of aspirin: Secondary | ICD-10-CM | POA: Insufficient documentation

## 2017-10-08 DIAGNOSIS — Z7984 Long term (current) use of oral hypoglycemic drugs: Secondary | ICD-10-CM | POA: Diagnosis not present

## 2017-10-08 DIAGNOSIS — M25531 Pain in right wrist: Secondary | ICD-10-CM | POA: Diagnosis present

## 2017-10-08 DIAGNOSIS — E119 Type 2 diabetes mellitus without complications: Secondary | ICD-10-CM | POA: Insufficient documentation

## 2017-10-08 DIAGNOSIS — Z79899 Other long term (current) drug therapy: Secondary | ICD-10-CM | POA: Insufficient documentation

## 2017-10-08 MED ORDER — IBUPROFEN 600 MG PO TABS: 600.0000 mg | ORAL_TABLET | Freq: Four times a day (QID) | ORAL | 0 refills | Status: AC | PRN

## 2017-10-08 NOTE — ED Triage Notes (Addendum)
Right wrist pain that is worse when he sleeps. He has a knot on his wrist. No injury. His xray ordered by his MD was negative.

## 2017-10-08 NOTE — ED Provider Notes (Signed)
[facility] EMERGENCY DEPARTMENT Provider Note   CSN: [id number] Arrival date & time: 10/08/17  1145     History   Chief Complaint Chief Complaint  Patient presents with  . Wrist Pain    HPI Thomas Trevino is a 46 y.o. male.  Patient presents to the emergency department with right wrist pain.  He states that he has had the pain ongoing for several weeks.  He reports that it is worse at night.  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  He reports that he has some tingling into his fingers.  He has not tried taking anything for this pain.  He reports being seen by his doctor and having negative imaging studies.  Denies any trauma, injury, or insect bites.  He denies any other associated symptoms.   The history is provided by the patient. No language interpreter was used.    Past Medical History:  Diagnosis Date  . Diabetes mellitus without complication (HCC)   . Genital herpes     There are no active problems to display for this patient.   History reviewed. No pertinent surgical history.     Home Medications    Prior to Admission medications   Medication Sig Start Date End Date Taking? Authorizing Provider  metFORMIN (GLUCOPHAGE) 500 MG tablet Take 1 tablet (500 mg total) by mouth 2 (two) times daily with a meal. 01/11/17  Yes Ward, Chase PicketJaime Pilcher, PA-C  aspirin 81 MG chewable tablet Chew 1 tablet (81 mg total) by mouth daily. 12/10/14   Eber HongMiller, Brian, MD  ciprofloxacin (CIPRO) 500 MG tablet Take 1 tablet (500 mg total) by mouth 2 (two) times daily. Patient not taking: Reported on 02/27/2015 09/15/13   Derwood KaplanNanavati, Ankit, MD  diphenhydrAMINE (BENADRYL) 25 MG tablet Take 1 tablet (25 mg total) by mouth every 6 (six) hours. 02/21/15   Lurene ShadowPhelps, Erin O, PA-C  HYDROcodone-acetaminophen (NORCO/VICODIN) 5-325 MG per tablet Take 1 tablet by mouth every 6 (six) hours as needed. Patient not taking: Reported on 02/27/2015 09/15/13   Derwood KaplanNanavati, Ankit, MD    methocarbamol (ROBAXIN) 500 MG tablet Take 1 tablet (500 mg total) by mouth 2 (two) times daily as needed for muscle spasms. 01/11/17   Ward, Chase PicketJaime Pilcher, PA-C  metroNIDAZOLE (FLAGYL) 500 MG tablet Take 1 tablet (500 mg total) by mouth 2 (two) times daily. Patient not taking: Reported on 02/27/2015 09/15/13   Derwood KaplanNanavati, Ankit, MD  naproxen (NAPROSYN) 500 MG tablet Take 1 tablet (500 mg total) by mouth 2 (two) times daily as needed. 01/11/17   Ward, Chase PicketJaime Pilcher, PA-C  permethrin (ELIMITE) 5 % cream Apply to affected area once 02/27/15   Santiago GladLaisure, Heather, PA-C  predniSONE (DELTASONE) 20 MG tablet 2 tabs po daily x 3 days Patient not taking: Reported on 02/27/2015 02/21/15   Lurene ShadowPhelps, Erin O, PA-C  promethazine (PHENERGAN) 25 MG tablet Take 1 tablet (25 mg total) by mouth every 6 (six) hours as needed for nausea. Patient not taking: Reported on 02/27/2015 09/15/13   Derwood KaplanNanavati, Ankit, MD  triamcinolone cream (KENALOG) 0.1 % Apply 1 application topically 2 (two) times daily. 02/27/15   Santiago GladLaisure, Heather, PA-C    Family History No family history on file.  Social History Social History   Tobacco Use  . Smoking status: Current Some Day Smoker    Packs/day: 0.20    Types: Cigarettes  . Smokeless tobacco: Never Used  Substance Use Topics  . Alcohol use: No  . Drug  use: No     Allergies   Patient has no known allergies.   Review of Systems Review of Systems  All other systems reviewed and are negative.    Physical Exam Updated Vital Signs BP 131/77   Pulse 92   Temp 98.5 F (36.9 C) (Oral)   Resp 18   Ht 5\' 8"  (1.727 m)   Wt 95.7 kg (211 lb)   SpO2 99%   BMI 32.08 kg/m   Physical Exam Nursing note and vitals reviewed.  Constitutional: Pt appears well-developed and well-nourished. No distress.  HENT:  Head: Normocephalic and atraumatic.  Eyes: Conjunctivae are normal.  Neck: Normal range of motion.  Cardiovascular: Normal rate, regular rhythm. Intact distal pulses.   Capillary  refill < 3 sec.  Pulmonary/Chest: Effort normal and breath sounds normal.  Musculoskeletal:  Right wrist Pt exhibits no focal bony abnormality or deformity, positive phalen.   ROM: 5/5  Strength: 5/5  Neurological: Pt  is alert. Coordination normal.  Sensation: 5/5 Skin: Skin is warm and dry. Pt is not diaphoretic.  No evidence of open wound or skin tenting Psychiatric: Pt has a normal mood and affect.     ED Treatments / Results  Labs (all labs ordered are listed, but only abnormal results are displayed) Labs Reviewed - No data to display  EKG  EKG Interpretation None       Radiology No results found.  Procedures Procedures (including critical care time)  Medications Ordered in ED Medications - No data to display   Initial Impression / Assessment and Plan / ED Course  I have reviewed the triage vital signs and the nursing notes.  Pertinent labs & imaging results that were available during my care of the patient were reviewed by me and considered in my medical decision making (see chart for details).     Patient presents with right wrist pain.  DDx includes overuse injury, strain, or sprain.  Pt advised to follow up with PCP and/or orthopedics. Patient given splint while in ED, conservative therapy such as RICE recommended and discussed.   Patient will be discharged home & is agreeable with above plan. Returns precautions discussed. Pt appears safe for discharge.   Final Clinical Impressions(s) / ED Diagnoses   Final diagnoses:  Right wrist pain    ED Discharge Orders        Ordered    ibuprofen (ADVIL,MOTRIN) 600 MG tablet  Every 6 hours PRN     10/08/17 1449       Roxy HorsemanBrowning, Kyona Chauncey, PA-C 10/08/17 1450    Tegeler, Canary Brimhristopher J, MD 10/08/17 1729

## 2018-09-12 ENCOUNTER — Encounter (HOSPITAL_BASED_OUTPATIENT_CLINIC_OR_DEPARTMENT_OTHER): Payer: Self-pay

## 2018-09-12 ENCOUNTER — Emergency Department (HOSPITAL_BASED_OUTPATIENT_CLINIC_OR_DEPARTMENT_OTHER)
Admission: EM | Admit: 2018-09-12 | Discharge: 2018-09-12 | Discharge status: Against Medical Advice | Payer: 59 | Attending: Emergency Medicine | Admitting: Emergency Medicine

## 2018-09-12 ENCOUNTER — Other Ambulatory Visit: Payer: Self-pay

## 2018-09-12 DIAGNOSIS — Z5321 Procedure and treatment not carried out due to patient leaving prior to being seen by health care provider: Secondary | ICD-10-CM | POA: Insufficient documentation

## 2018-09-12 DIAGNOSIS — R109 Unspecified abdominal pain: Secondary | ICD-10-CM | POA: Insufficient documentation

## 2018-09-12 HISTORY — DX: Diverticulitis of intestine, part unspecified, without perforation or abscess without bleeding: K57.92

## 2018-09-12 LAB — URINALYSIS, ROUTINE W REFLEX MICROSCOPIC
Bilirubin Urine: NEGATIVE
Glucose, UA: 100 mg/dL — AB
Hgb urine dipstick: NEGATIVE
KETONES UR: 15 mg/dL — AB
Leukocytes, UA: NEGATIVE
NITRITE: NEGATIVE
PROTEIN: NEGATIVE mg/dL
Specific Gravity, Urine: 1.02 (ref 1.005–1.030)
pH: 7.5 (ref 5.0–8.0)

## 2018-09-12 NOTE — ED Triage Notes (Signed)
Pt c/o LLQ pain, lower back pain, constipation, dysuria x 2 days-states feels same as when he was dx with diverticulitis 5 yrs ago-NAD-steady gait

## 2022-01-16 DIAGNOSIS — L409 Psoriasis, unspecified: Secondary | ICD-10-CM | POA: Insufficient documentation

## 2022-10-17 ENCOUNTER — Emergency Department (HOSPITAL_BASED_OUTPATIENT_CLINIC_OR_DEPARTMENT_OTHER)
Admission: EM | Admit: 2022-10-17 | Discharge: 2022-10-17 | Disposition: A | Discharge status: Home / Self Care | Payer: 59 | Attending: Emergency Medicine | Admitting: Emergency Medicine

## 2022-10-17 ENCOUNTER — Encounter (HOSPITAL_BASED_OUTPATIENT_CLINIC_OR_DEPARTMENT_OTHER): Payer: Self-pay | Admitting: Emergency Medicine

## 2022-10-17 DIAGNOSIS — R066 Hiccough: Secondary | ICD-10-CM | POA: Diagnosis not present

## 2022-10-17 DIAGNOSIS — R509 Fever, unspecified: Secondary | ICD-10-CM | POA: Diagnosis present

## 2022-10-17 DIAGNOSIS — Z7984 Long term (current) use of oral hypoglycemic drugs: Secondary | ICD-10-CM | POA: Diagnosis not present

## 2022-10-17 DIAGNOSIS — J101 Influenza due to other identified influenza virus with other respiratory manifestations: Secondary | ICD-10-CM | POA: Insufficient documentation

## 2022-10-17 DIAGNOSIS — Z7982 Long term (current) use of aspirin: Secondary | ICD-10-CM | POA: Diagnosis not present

## 2022-10-17 DIAGNOSIS — Z20822 Contact with and (suspected) exposure to covid-19: Secondary | ICD-10-CM | POA: Diagnosis not present

## 2022-10-17 DIAGNOSIS — E119 Type 2 diabetes mellitus without complications: Secondary | ICD-10-CM | POA: Insufficient documentation

## 2022-10-17 LAB — RESP PANEL BY RT-PCR (RSV, FLU A&B, COVID)  RVPGX2
Influenza A by PCR: POSITIVE — AB
Influenza B by PCR: NEGATIVE
Resp Syncytial Virus by PCR: NEGATIVE
SARS Coronavirus 2 by RT PCR: NEGATIVE

## 2022-10-17 MED ORDER — ACETAMINOPHEN 325 MG PO TABS
650.0000 mg | ORAL_TABLET | Freq: Once | ORAL | Status: AC | PRN
  Administered 2022-10-17: 650 mg via ORAL
  Filled 2022-10-17: qty 2

## 2022-10-17 MED ORDER — METFORMIN HCL 500 MG PO TABS: 500.0000 mg | ORAL_TABLET | Freq: Two times a day (BID) | ORAL | 1 refills | Status: DC

## 2022-10-17 NOTE — ED Provider Notes (Signed)
[facility] EMERGENCY DEPARTMENT Provider Note   CSN: [id number] Arrival date & time: 10/17/22  2440     History {Add pertinent medical, surgical, social history, OB history to HPI:1} Chief Complaint  Patient presents with   URI   Hiccups    Jessie Schrieber is a 51 y.o. male.  51 year old male with type 2 diabetes who presents to the emergency department with fevers, cough, headache, and myalgias.  States that on Sunday started having some mild cough after going to church where he thinks he could potentially been exposed to infection.  Says that since then has developed fevers as well as a headache and myalgias.  Has been taking Tylenol at home without significant improvement of his symptoms so decided to come into the emergency department for evaluation.  Also reports that he needs refill on his metformin today.       Home Medications Prior to Admission medications   Medication Sig Start Date End Date Taking? Authorizing Provider  aspirin 81 MG chewable tablet Chew 1 tablet (81 mg total) by mouth daily. 12/10/14   Eber Hong, MD  ciprofloxacin (CIPRO) 500 MG tablet Take 1 tablet (500 mg total) by mouth 2 (two) times daily. Patient not taking: Reported on 02/27/2015 09/15/13   Derwood Kaplan, MD  diphenhydrAMINE (BENADRYL) 25 MG tablet Take 1 tablet (25 mg total) by mouth every 6 (six) hours. 02/21/15   Lurene Shadow, PA-C  HYDROcodone-acetaminophen (NORCO/VICODIN) 5-325 MG per tablet Take 1 tablet by mouth every 6 (six) hours as needed. Patient not taking: Reported on 02/27/2015 09/15/13   Derwood Kaplan, MD  ibuprofen (ADVIL,MOTRIN) 600 MG tablet Take 1 tablet (600 mg total) by mouth every 6 (six) hours as needed. 10/08/17   Roxy Horseman, PA-C  metFORMIN (GLUCOPHAGE) 500 MG tablet Take 1 tablet (500 mg total) by mouth 2 (two) times daily with a meal. 01/11/17   Ward, Chase Picket, PA-C  methocarbamol (ROBAXIN) 500 MG tablet Take 1 tablet (500 mg total) by mouth 2  (two) times daily as needed for muscle spasms. 01/11/17   Ward, Chase Picket, PA-C  metroNIDAZOLE (FLAGYL) 500 MG tablet Take 1 tablet (500 mg total) by mouth 2 (two) times daily. Patient not taking: Reported on 02/27/2015 09/15/13   Derwood Kaplan, MD  naproxen (NAPROSYN) 500 MG tablet Take 1 tablet (500 mg total) by mouth 2 (two) times daily as needed. 01/11/17   Ward, Chase Picket, PA-C  permethrin (ELIMITE) 5 % cream Apply to affected area once 02/27/15   Santiago Glad, PA-C  predniSONE (DELTASONE) 20 MG tablet 2 tabs po daily x 3 days Patient not taking: Reported on 02/27/2015 02/21/15   Lurene Shadow, PA-C  promethazine (PHENERGAN) 25 MG tablet Take 1 tablet (25 mg total) by mouth every 6 (six) hours as needed for nausea. Patient not taking: Reported on 02/27/2015 09/15/13   Derwood Kaplan, MD  triamcinolone cream (KENALOG) 0.1 % Apply 1 application topically 2 (two) times daily. 02/27/15   Santiago Glad, PA-C      Allergies    Patient has no known allergies.    Review of Systems   Review of Systems  Physical Exam Updated Vital Signs BP 123/79 (BP Location: Left Arm)   Pulse 93   Temp (!) 100.5 F (38.1 C) (Oral)   Resp 20   Ht 5\' 8"  (1.727 m)   Wt 92.5 kg   SpO2 100%   BMI 31.02 kg/m  Physical Exam Vitals and nursing note reviewed.  Constitutional:  General: He is not in acute distress.    Appearance: He is well-developed.     Comments: Occasional hiccups  HENT:     Head: Normocephalic and atraumatic.     Right Ear: External ear normal.     Left Ear: External ear normal.     Nose: Congestion present.  Eyes:     Extraocular Movements: Extraocular movements intact.     Conjunctiva/sclera: Conjunctivae normal.     Pupils: Pupils are equal, round, and reactive to light.  Cardiovascular:     Rate and Rhythm: Normal rate and regular rhythm.     Heart sounds: Normal heart sounds.  Pulmonary:     Effort: Pulmonary effort is normal. No respiratory distress.     Breath  sounds: Normal breath sounds.  Musculoskeletal:     Cervical back: Normal range of motion and neck supple.     Right lower leg: No edema.     Left lower leg: No edema.  Skin:    General: Skin is warm and dry.  Neurological:     Mental Status: He is alert. Mental status is at baseline.  Psychiatric:        Mood and Affect: Mood normal.        Behavior: Behavior normal.     ED Results / Procedures / Treatments   Labs (all labs ordered are listed, but only abnormal results are displayed) Labs Reviewed  RESP PANEL BY RT-PCR (RSV, FLU A&B, COVID)  RVPGX2 - Abnormal; Notable for the following components:      Result Value   Influenza A by PCR POSITIVE (*)    All other components within normal limits    EKG None  Radiology No results found.  Procedures Procedures  {Document cardiac monitor, telemetry assessment procedure when appropriate:1}  Medications Ordered in ED Medications  acetaminophen (TYLENOL) tablet 650 mg (650 mg Oral Given 10/17/22 0746)    ED Course/ Medical Decision Making/ A&P                           Medical Decision Making Risk OTC drugs. Prescription drug management.   *** Informed to follow-up with his primary care doctor if his hiccups do not resolve with his COVID.  Would not benefit from Tamiflu given the onset of his symptoms  {Document critical care time when appropriate:1} {Document review of labs and clinical decision tools ie heart score, Chads2Vasc2 etc:1}  {Document your independent review of radiology images, and any outside records:1} {Document your discussion with family members, caretakers, and with consultants:1} {Document social determinants of health affecting pt's care:1} {Document your decision making why or why not admission, treatments were needed:1} Final Clinical Impression(s) / ED Diagnoses Final diagnoses:  None    Rx / DC Orders ED Discharge Orders     None

## 2022-10-17 NOTE — ED Triage Notes (Signed)
URI Sx since Sunday, using OTC meds with some relief. Has had hiccups that cause headache.

## 2022-10-17 NOTE — Discharge Instructions (Signed)
You were seen for your flu and hiccups in the emergency department.   At home, please take Tylenol and ibuprofen as needed for any fevers or chills he may have.    Follow-up with your primary doctor in 2-3 days regarding your visit.  If you do not have a primary care doctor you may follow-up with  primary care at [facility].  Your metformin prescription was also filled.  Return immediately to the emergency department if you experience any of the following: Difficulty breathing, chest pain, or any other concerning symptoms.    Thank you for visiting our Emergency Department. It was a pleasure taking care of you today.

## 2022-10-17 NOTE — ED Notes (Signed)
Discharge instructions reviewed with patient. Patient verbalizes understanding, no further questions at this time. Medications/prescriptions and follow up information provided. No acute distress noted at time of departure.  

## 2022-10-26 ENCOUNTER — Encounter: Payer: Self-pay | Admitting: Family

## 2022-10-26 ENCOUNTER — Ambulatory Visit (INDEPENDENT_AMBULATORY_CARE_PROVIDER_SITE_OTHER): Payer: 59 | Admitting: Family

## 2022-10-26 VITALS — BP 112/83 | HR 98 | Ht 68.0 in | Wt 208.0 lb

## 2022-10-26 DIAGNOSIS — Z125 Encounter for screening for malignant neoplasm of prostate: Secondary | ICD-10-CM

## 2022-10-26 DIAGNOSIS — Z8709 Personal history of other diseases of the respiratory system: Secondary | ICD-10-CM

## 2022-10-26 DIAGNOSIS — E119 Type 2 diabetes mellitus without complications: Secondary | ICD-10-CM | POA: Diagnosis not present

## 2022-10-26 NOTE — Progress Notes (Signed)
  Thomas Trevino is a 52 y.o. male with the following history as recorded in EpicCare:  There are no problems to display for this patient.   Current Outpatient Medications  Medication Sig Dispense Refill   ibuprofen (ADVIL,MOTRIN) 600 MG tablet Take 1 tablet (600 mg total) by mouth every 6 (six) hours as needed. 30 tablet 0   metFORMIN (GLUCOPHAGE) 500 MG tablet Take 1 tablet (500 mg total) by mouth 2 (two) times daily with a meal. 60 tablet 1   triamcinolone ointment (KENALOG) 0.1 % Apply 1 Application topically 2 (two) times daily.     No current facility-administered medications for this visit.    Allergies: Patient has no known allergies.  Past Medical History:  Diagnosis Date   Diabetes mellitus without complication ([place])    Diverticulitis    Genital herpes     No past surgical history on file.  No family history on file.  Social History   Tobacco Use   Smoking status: Every Day    Packs/day: 0.20    Types: Cigarettes   Smokeless tobacco: Never  Substance Use Topics   Alcohol use: Yes    Comment: occ    Subjective:   Presents today as a new patient; was seen in the ER on 12/26 with influenza; notes he "just hasn't felt well" for the past few days; no chest pain, shortness of breath, wheezing, fever;  Has decided he is ready to quit smoking after having the flu; History of Type 2 Diabetes- has not been seen regularly/ doing regular follow ups; was given refill on Metformin at ER; thinks his last Hgba1c was checked over a year ago;    Objective:  Vitals:   10/26/22 1456  BP: 112/83  Pulse: 98  SpO2: 99%  Weight: 208 lb (94.3 kg)  Height: _0  (1.727 m)    General: Well developed, well nourished, in no acute distress  Skin : Warm and dry.  Head: Normocephalic and atraumatic  Eyes: Sclera and conjunctiva clear; pupils round and reactive to light; extraocular movements intact  Ears: External normal; canals clear; tympanic membranes normal  Oropharynx: Pink, supple.  No suspicious lesions  Neck: Supple without thyromegaly, adenopathy  Lungs: Respirations unlabored; clear to auscultation bilaterally without wheeze, rales, rhonchi  CVS exam: normal rate and regular rhythm.  Neurologic: Alert and oriented; speech intact; face symmetrical; moves all extremities well; CNII-XII intact without focal deficit   Assessment:  1. Type 2 diabetes mellitus without complication, without long-term current use of insulin ([place])   2. Prostate cancer screening   3. History of influenza     Plan:  Per patient, he has been taking his Metformin regularly even though has not had routine primary care follow up for the past 1- 11/2 years? Will update labs today and follow up to be determined; Check PSA level; Physical exam is reassuring- do not see any symptoms for secondary infection; do not feel antibiotic is warranted at this time;   No follow-ups on file.  Orders Placed This Encounter  Procedures   CBC with Differential/Platelet   Comp Met (CMET)   Hemoglobin A1c   Lipid panel   PSA    Requested Prescriptions    No prescriptions requested or ordered in this encounter

## 2022-10-27 LAB — CBC WITH DIFFERENTIAL/PLATELET
Basophils Absolute: 0 10*3/uL (ref 0.0–0.1)
Basophils Relative: 0.5 % (ref 0.0–3.0)
Eosinophils Absolute: 0.2 10*3/uL (ref 0.0–0.7)
Eosinophils Relative: 1.7 % (ref 0.0–5.0)
HCT: 43.7 % (ref 39.0–52.0)
Hemoglobin: 15 g/dL (ref 13.0–17.0)
Lymphocytes Relative: 33.3 % (ref 12.0–46.0)
Lymphs Abs: 3.2 10*3/uL (ref 0.7–4.0)
MCHC: 34.3 g/dL (ref 30.0–36.0)
MCV: 89.2 fl (ref 78.0–100.0)
Monocytes Absolute: 0.5 10*3/uL (ref 0.1–1.0)
Monocytes Relative: 5.6 % (ref 3.0–12.0)
Neutro Abs: 5.7 10*3/uL (ref 1.4–7.7)
Neutrophils Relative %: 58.9 % (ref 43.0–77.0)
Platelets: 259 10*3/uL (ref 150.0–400.0)
RBC: 4.9 Mil/uL (ref 4.22–5.81)
RDW: 12.6 % (ref 11.5–15.5)
WBC: 9.6 10*3/uL (ref 4.0–10.5)

## 2022-10-27 LAB — COMPREHENSIVE METABOLIC PANEL
ALT: 28 U/L (ref 0–53)
AST: 17 U/L (ref 0–37)
Albumin: 4.3 g/dL (ref 3.5–5.2)
Alkaline Phosphatase: 72 U/L (ref 39–117)
BUN: 14 mg/dL (ref 6–23)
CO2: 29 mEq/L (ref 19–32)
Calcium: 9.2 mg/dL (ref 8.4–10.5)
Chloride: 99 mEq/L (ref 96–112)
Creatinine, Ser: 1.06 mg/dL (ref 0.40–1.50)
GFR: 80.38 mL/min (ref >60.00)
Glucose, Bld: 220 mg/dL — ABNORMAL HIGH (ref 70–99)
Potassium: 4.4 mEq/L (ref 3.5–5.1)
Sodium: 135 mEq/L (ref 135–145)
Total Bilirubin: 0.3 mg/dL (ref 0.2–1.2)
Total Protein: 6.7 g/dL (ref 6.0–8.3)

## 2022-10-27 LAB — LIPID PANEL
Cholesterol: 157 mg/dL (ref 0–200)
HDL: 27.5 mg/dL — ABNORMAL LOW (ref >39.00)
LDL Cholesterol: 94 mg/dL (ref 0–99)
NonHDL: 129.95
Total CHOL/HDL Ratio: 6
Triglycerides: 181 mg/dL — ABNORMAL HIGH (ref 0.0–149.0)
VLDL: 36.2 mg/dL (ref 0.0–40.0)

## 2022-10-27 LAB — HEMOGLOBIN A1C: Hgb A1c MFr Bld: 11 % — ABNORMAL HIGH (ref 4.6–6.5)

## 2022-10-27 LAB — PSA: PSA: 0.98 ng/mL (ref 0.10–4.00)

## 2022-10-30 ENCOUNTER — Other Ambulatory Visit: Payer: Self-pay | Admitting: Family

## 2022-10-30 MED ORDER — ROSUVASTATIN CALCIUM 10 MG PO TABS: 10.0000 mg | ORAL_TABLET | Freq: Every day | ORAL | 3 refills | Status: DC

## 2022-10-30 MED ORDER — METFORMIN HCL 500 MG PO TABS: 1000.0000 mg | ORAL_TABLET | Freq: Two times a day (BID) | ORAL | 3 refills | Status: DC

## 2022-11-01 ENCOUNTER — Other Ambulatory Visit: Payer: Self-pay

## 2022-11-01 MED ORDER — ROSUVASTATIN CALCIUM 10 MG PO TABS: 10.0000 mg | ORAL_TABLET | Freq: Every day | ORAL | 3 refills | Status: DC

## 2022-11-01 MED ORDER — METFORMIN HCL 500 MG PO TABS: 1000.0000 mg | ORAL_TABLET | Freq: Two times a day (BID) | ORAL | 3 refills | Status: DC

## 2023-02-01 ENCOUNTER — Ambulatory Visit: Payer: 59 | Admitting: Family

## 2023-11-28 DIAGNOSIS — K4090 Unilateral inguinal hernia, without obstruction or gangrene, not specified as recurrent: Secondary | ICD-10-CM | POA: Diagnosis not present

## 2023-11-28 DIAGNOSIS — K5730 Diverticulosis of large intestine without perforation or abscess without bleeding: Secondary | ICD-10-CM | POA: Diagnosis not present

## 2023-11-28 DIAGNOSIS — R110 Nausea: Secondary | ICD-10-CM | POA: Diagnosis not present

## 2023-11-28 DIAGNOSIS — R1032 Left lower quadrant pain: Secondary | ICD-10-CM | POA: Diagnosis not present

## 2023-11-28 DIAGNOSIS — K5792 Diverticulitis of intestine, part unspecified, without perforation or abscess without bleeding: Secondary | ICD-10-CM | POA: Diagnosis not present

## 2023-12-28 ENCOUNTER — Other Ambulatory Visit: Payer: Self-pay | Admitting: Family

## 2023-12-28 NOTE — Telephone Encounter (Signed)
 Copied from CRM [id number]. Topic: Clinical - Medication Refill >> Dec 28, 2023 11:06 AM Kathryne Eriksson wrote: Most Recent Primary Care Visit:  Provider: Ria Clock [facility]  Department: [facility]  Visit Type: NEW PATIENT  Date: 10/26/2022  Medication: metFORMIN (GLUCOPHAGE) 500 MG tablet  Has the patient contacted their pharmacy? Yes (Agent: If no, request that the patient contact the pharmacy for the refill. If patient does not wish to contact the pharmacy document the reason why and proceed with request.) (Agent: If yes, when and what did the pharmacy advise?)  Is this the correct pharmacy for this prescription? Yes If no, delete pharmacy and type the correct one.  This is the patient's preferred pharmacy:   [id number], [place] - [address] AT [facility] [address] [place] [address] Phone: [phone] Fax: [phone]   Has the prescription been filled recently? No  Is the patient out of the medication? Yes  Has the patient been seen for an appointment in the last year OR does the patient have an upcoming appointment? Yes  Can we respond through MyChart? Yes  Agent: Please be advised that Rx refills may take up to 3 business days. We ask that you follow-up with your pharmacy.

## 2024-01-08 ENCOUNTER — Encounter: Payer: Self-pay | Admitting: Family

## 2024-01-08 ENCOUNTER — Ambulatory Visit (INDEPENDENT_AMBULATORY_CARE_PROVIDER_SITE_OTHER): Admitting: Family

## 2024-01-08 VITALS — BP 130/82 | HR 62 | Ht 68.0 in | Wt 203.0 lb

## 2024-01-08 DIAGNOSIS — Z13220 Encounter for screening for lipoid disorders: Secondary | ICD-10-CM

## 2024-01-08 DIAGNOSIS — Z125 Encounter for screening for malignant neoplasm of prostate: Secondary | ICD-10-CM | POA: Diagnosis not present

## 2024-01-08 DIAGNOSIS — E119 Type 2 diabetes mellitus without complications: Secondary | ICD-10-CM | POA: Diagnosis not present

## 2024-01-08 DIAGNOSIS — Z7984 Long term (current) use of oral hypoglycemic drugs: Secondary | ICD-10-CM

## 2024-01-08 MED ORDER — METFORMIN HCL ER 500 MG PO TB24: 500.0000 mg | ORAL_TABLET | Freq: Two times a day (BID) | ORAL | 1 refills | Status: DC

## 2024-01-08 NOTE — Progress Notes (Signed)
  Thomas Trevino is a 53 y.o. male with the following history as recorded in EpicCare:  Patient Active Problem List   Diagnosis Date Noted   Type 2 diabetes mellitus (HCC) 10/26/2022   Psoriasis 01/16/2022    Current Outpatient Medications  Medication Sig Dispense Refill   ibuprofen (ADVIL,MOTRIN) 600 MG tablet Take 1 tablet (600 mg total) by mouth every 6 (six) hours as needed. 30 tablet 0   metFORMIN (GLUCOPHAGE-XR) 500 MG 24 hr tablet Take 1 tablet (500 mg total) by mouth 2 (two) times daily with a meal. 60 tablet 1   triamcinolone ointment (KENALOG) 0.1 % Apply 1 Application topically 2 (two) times daily.     No current facility-administered medications for this visit.    Allergies: Patient has no known allergies.  Past Medical History:  Diagnosis Date   Diabetes mellitus without complication (HCC)    Diverticulitis    Genital herpes     No past surgical history on file.  No family history on file.  Social History   Tobacco Use   Smoking status: Every Day    Current packs/day: 0.20    Types: Cigarettes   Smokeless tobacco: Never  Substance Use Topics   Alcohol use: Yes    Comment: occ    Subjective:   Patient last seen in January 2024- did not follow up as recommended; has known history of Type 2 Diabetes- has not been taking his medication as prescribed; has been using some of his brother's medication; Denies any chest pain, shortness of breath, blurred vision or headache. Does not want to re-start Crestor at this time- would like to get lipid panel updated first;   Objective:  Vitals:   01/08/24 1542  BP: 130/82  Pulse: 62  SpO2: 97%  Weight: 203 lb (92.1 kg)  Height: 5\' 8"  (1.727 m)    General: Well developed, well nourished, in no acute distress  Skin : Warm and dry.  Head: Normocephalic and atraumatic  Eyes: Sclera and conjunctiva clear; pupils round and reactive to light; extraocular movements intact  Ears: External normal; canals clear; tympanic  membranes normal  Oropharynx: Pink, supple. No suspicious lesions  Neck: Supple without thyromegaly, adenopathy  Lungs: Respirations unlabored; clear to auscultation bilaterally without wheeze, rales, rhonchi  CVS exam: normal rate and regular rhythm.  Neurologic: Alert and oriented; speech intact; face symmetrical; moves all extremities well; CNII-XII intact without focal deficit   Assessment:  1. Type 2 diabetes mellitus without complication, without long-term current use of insulin (HCC)   2. Lipid screening   3. Prostate cancer screening     Plan:  Re-start Metformin XR 500 mg bid; check CBC, CMP, lipid panel, hgba1c; follow up in 1 month, sooner prn.   Return in about 4 weeks (around 02/05/2024) for follow up.  Orders Placed This Encounter  Procedures   CBC with Differential/Platelet   Comp Met (CMET)   Lipid panel   Hemoglobin A1c   PSA    Requested Prescriptions   Signed Prescriptions Disp Refills   metFORMIN (GLUCOPHAGE-XR) 500 MG 24 hr tablet 60 tablet 1    Sig: Take 1 tablet (500 mg total) by mouth 2 (two) times daily with a meal.

## 2024-01-09 ENCOUNTER — Encounter: Payer: Self-pay | Admitting: Family

## 2024-01-09 LAB — COMPREHENSIVE METABOLIC PANEL
ALT: 20 U/L (ref 0–53)
AST: 14 U/L (ref 0–37)
Albumin: 4.3 g/dL (ref 3.5–5.2)
Alkaline Phosphatase: 62 U/L (ref 39–117)
BUN: 14 mg/dL (ref 6–23)
CO2: 26 meq/L (ref 19–32)
Calcium: 9.1 mg/dL (ref 8.4–10.5)
Chloride: 103 meq/L (ref 96–112)
Creatinine, Ser: 0.99 mg/dL (ref 0.40–1.50)
GFR: 86.52 mL/min (ref >60.00)
Glucose, Bld: 191 mg/dL — ABNORMAL HIGH (ref 70–99)
Potassium: 4.1 meq/L (ref 3.5–5.1)
Sodium: 137 meq/L (ref 135–145)
Total Bilirubin: 0.4 mg/dL (ref 0.2–1.2)
Total Protein: 6.8 g/dL (ref 6.0–8.3)

## 2024-01-09 LAB — LIPID PANEL
Cholesterol: 181 mg/dL (ref 0–200)
HDL: 34.2 mg/dL — ABNORMAL LOW (ref >39.00)
LDL Cholesterol: 116 mg/dL — ABNORMAL HIGH (ref 0–99)
NonHDL: 147.2
Total CHOL/HDL Ratio: 5
Triglycerides: 154 mg/dL — ABNORMAL HIGH (ref 0.0–149.0)
VLDL: 30.8 mg/dL (ref 0.0–40.0)

## 2024-01-09 LAB — CBC WITH DIFFERENTIAL/PLATELET
Basophils Absolute: 0 10*3/uL (ref 0.0–0.1)
Basophils Relative: 0.2 % (ref 0.0–3.0)
Eosinophils Absolute: 0.3 10*3/uL (ref 0.0–0.7)
Eosinophils Relative: 2.5 % (ref 0.0–5.0)
HCT: 42.9 % (ref 39.0–52.0)
Hemoglobin: 14.9 g/dL (ref 13.0–17.0)
Lymphocytes Relative: 33.1 % (ref 12.0–46.0)
Lymphs Abs: 3.3 10*3/uL (ref 0.7–4.0)
MCHC: 34.7 g/dL (ref 30.0–36.0)
MCV: 91.9 fl (ref 78.0–100.0)
Monocytes Absolute: 0.6 10*3/uL (ref 0.1–1.0)
Monocytes Relative: 5.7 % (ref 3.0–12.0)
Neutro Abs: 5.8 10*3/uL (ref 1.4–7.7)
Neutrophils Relative %: 58.5 % (ref 43.0–77.0)
Platelets: 219 10*3/uL (ref 150.0–400.0)
RBC: 4.66 Mil/uL (ref 4.22–5.81)
RDW: 13 % (ref 11.5–15.5)
WBC: 9.9 10*3/uL (ref 4.0–10.5)

## 2024-01-09 LAB — PSA: PSA: 1.1 ng/mL (ref 0.10–4.00)

## 2024-01-09 LAB — HEMOGLOBIN A1C: Hgb A1c MFr Bld: 10.5 % — ABNORMAL HIGH (ref 4.6–6.5)

## 2024-02-14 ENCOUNTER — Ambulatory Visit: Admitting: Family

## 2024-03-17 ENCOUNTER — Other Ambulatory Visit: Payer: Self-pay | Admitting: Family

## 2024-04-27 ENCOUNTER — Other Ambulatory Visit: Payer: Self-pay | Admitting: Family

## 2024-06-05 ENCOUNTER — Other Ambulatory Visit: Payer: Self-pay | Admitting: Family

## 2024-06-28 ENCOUNTER — Other Ambulatory Visit: Payer: Self-pay | Admitting: Family

## 2024-08-08 ENCOUNTER — Other Ambulatory Visit: Payer: Self-pay

## 2024-08-08 ENCOUNTER — Emergency Department (HOSPITAL_BASED_OUTPATIENT_CLINIC_OR_DEPARTMENT_OTHER)

## 2024-08-08 ENCOUNTER — Encounter (HOSPITAL_BASED_OUTPATIENT_CLINIC_OR_DEPARTMENT_OTHER): Payer: Self-pay | Admitting: Emergency Medicine

## 2024-08-08 ENCOUNTER — Emergency Department (HOSPITAL_BASED_OUTPATIENT_CLINIC_OR_DEPARTMENT_OTHER)
Admission: EM | Admit: 2024-08-08 | Discharge: 2024-08-08 | Disposition: A | Discharge status: Home / Self Care | Attending: Emergency Medicine | Admitting: Emergency Medicine

## 2024-08-08 DIAGNOSIS — Z7984 Long term (current) use of oral hypoglycemic drugs: Secondary | ICD-10-CM | POA: Insufficient documentation

## 2024-08-08 DIAGNOSIS — E119 Type 2 diabetes mellitus without complications: Secondary | ICD-10-CM | POA: Insufficient documentation

## 2024-08-08 DIAGNOSIS — K5730 Diverticulosis of large intestine without perforation or abscess without bleeding: Secondary | ICD-10-CM | POA: Insufficient documentation

## 2024-08-08 DIAGNOSIS — K4090 Unilateral inguinal hernia, without obstruction or gangrene, not specified as recurrent: Secondary | ICD-10-CM | POA: Diagnosis not present

## 2024-08-08 DIAGNOSIS — R1032 Left lower quadrant pain: Secondary | ICD-10-CM | POA: Diagnosis not present

## 2024-08-08 DIAGNOSIS — K5790 Diverticulosis of intestine, part unspecified, without perforation or abscess without bleeding: Secondary | ICD-10-CM

## 2024-08-08 LAB — URINALYSIS, ROUTINE W REFLEX MICROSCOPIC
Bilirubin Urine: NEGATIVE
Glucose, UA: 250 mg/dL — AB
Hgb urine dipstick: NEGATIVE
Ketones, ur: NEGATIVE mg/dL
Leukocytes,Ua: NEGATIVE
Nitrite: NEGATIVE
Protein, ur: NEGATIVE mg/dL
Specific Gravity, Urine: >=1.03 (ref 1.005–1.030)
pH: 6 (ref 5.0–8.0)

## 2024-08-08 LAB — CBC WITH DIFFERENTIAL/PLATELET
Abs Immature Granulocytes: 0.01 K/uL (ref 0.00–0.07)
Basophils Absolute: 0.1 K/uL (ref 0.0–0.1)
Basophils Relative: 1 %
Eosinophils Absolute: 0.2 K/uL (ref 0.0–0.5)
Eosinophils Relative: 3 %
HCT: 44.2 % (ref 39.0–52.0)
Hemoglobin: 15.3 g/dL (ref 13.0–17.0)
Immature Granulocytes: 0 %
Lymphocytes Relative: 33 %
Lymphs Abs: 2.7 K/uL (ref 0.7–4.0)
MCH: 31.2 pg (ref 26.0–34.0)
MCHC: 34.6 g/dL (ref 30.0–36.0)
MCV: 90 fL (ref 80.0–100.0)
Monocytes Absolute: 0.5 K/uL (ref 0.1–1.0)
Monocytes Relative: 6 %
Neutro Abs: 4.8 K/uL (ref 1.7–7.7)
Neutrophils Relative %: 57 %
Platelets: 220 K/uL (ref 150–400)
RBC: 4.91 MIL/uL (ref 4.22–5.81)
RDW: 12.3 % (ref 11.5–15.5)
WBC: 8.3 K/uL (ref 4.0–10.5)
nRBC: 0 % (ref 0.0–0.2)

## 2024-08-08 LAB — COMPREHENSIVE METABOLIC PANEL WITH GFR
ALT: 23 U/L (ref 0–44)
AST: 18 U/L (ref 15–41)
Albumin: 4.5 g/dL (ref 3.5–5.0)
Alkaline Phosphatase: 72 U/L (ref 38–126)
Anion gap: 10 (ref 5–15)
BUN: 11 mg/dL (ref 6–20)
CO2: 25 mmol/L (ref 22–32)
Calcium: 9.1 mg/dL (ref 8.9–10.3)
Chloride: 101 mmol/L (ref 98–111)
Creatinine, Ser: 1 mg/dL (ref 0.61–1.24)
GFR, Estimated: >60 mL/min (ref >60)
Glucose, Bld: 197 mg/dL — ABNORMAL HIGH (ref 70–99)
Potassium: 4.6 mmol/L (ref 3.5–5.1)
Sodium: 136 mmol/L (ref 135–145)
Total Bilirubin: 0.4 mg/dL (ref 0.0–1.2)
Total Protein: 7.2 g/dL (ref 6.5–8.1)

## 2024-08-08 LAB — LIPASE, BLOOD: Lipase: 28 U/L (ref 11–51)

## 2024-08-08 MED ORDER — IOHEXOL 300 MG/ML  SOLN
100.0000 mL | Freq: Once | INTRAMUSCULAR | Status: AC | PRN
  Administered 2024-08-08: 100 mL via INTRAVENOUS

## 2024-08-08 MED ORDER — ACETAMINOPHEN 500 MG PO TABS
1000.0000 mg | ORAL_TABLET | Freq: Once | ORAL | Status: AC
  Administered 2024-08-08: 1000 mg via ORAL
  Filled 2024-08-08: qty 2

## 2024-08-08 MED ORDER — ONDANSETRON HCL 4 MG/2ML IJ SOLN
4.0000 mg | Freq: Once | INTRAMUSCULAR | Status: DC
  Filled 2024-08-08: qty 2

## 2024-08-08 NOTE — ED Notes (Signed)

## 2024-08-08 NOTE — ED Provider Notes (Signed)
  Shared visit with Torrence, PA-C. Patient with LLQ pain and nausea. Denies any other infectious symptoms. Denies vomiting, diarrhea, hematochezia, dysuria, hematuria.  Denies fever. Obtaining labworkup and CT scan.  Lab work and CT reassuring. Patient to follow up with PCP. Patient feels better after Tylenol .   Hoy Fraction F, [place] 08/08/24 1426    Elnor Savant A, DO 08/14/24 1310

## 2024-08-08 NOTE — Discharge Instructions (Signed)
 It was a pleasure taking care of you today. You were seen in the Emergency Department for left lower quadrant abdominal pain. Your work-up was reassuring. Your CT/Labs showed no evidence of infection or acute abnormality to explain your symptoms.  I suspect this is likely a diverticulosis flareup.  I recommend bowel rest, a clear liquid diet and Tylenol  as needed for pain management. Refer to the attached documentation for further management of your symptoms. Follow up with your PCP if your symptoms worsen.  Please return to the ER if you experience chest pain, trouble breathing, intractable nausea/vomiting or any other life threatening illnesses.

## 2024-08-08 NOTE — ED Provider Notes (Signed)
 Between EMERGENCY DEPARTMENT AT [facility] Provider Note   CSN: [id number] Arrival date & time: 08/08/24  9060     Patient presents with: Abdominal Pain   Thomas Trevino is a 53 y.o. male medical history of diabetes and diverticulitis who presents emergency department for evaluation of abdominal pain.  Patient states he has had left lower quadrant pain for the last 2 days as well as nausea.  He denies headaches, chills, body aches, fever.  No vomiting or hematochezia.  He does report increased flatulence.  He is not currently nauseated at this time.  Patient does report a history of diverticulitis in the past.    Abdominal Pain      Prior to Admission medications   Medication Sig Start Date End Date Taking? Authorizing Provider  ibuprofen  (ADVIL ,MOTRIN ) 600 MG tablet Take 1 tablet (600 mg total) by mouth every 6 (six) hours as needed. 10/08/17   Vicky Charleston, PA-C  metFORMIN  (GLUCOPHAGE -XR) 500 MG 24 hr tablet TAKE 1 TABLET BY MOUTH TWICE A DAY WITH FOOD 06/30/24   Jason Leita Repine, FNP  triamcinolone  ointment (KENALOG ) 0.1 % Apply 1 Application topically 2 (two) times daily.    [provider]    Allergies: Patient has no known allergies.    Review of Systems  Gastrointestinal:  Positive for abdominal pain.    Updated Vital Signs BP (!) 141/97   Pulse 71   Temp 98.2 F (36.8 C) (Oral)   Resp 18   Ht 5' 8.5 (1.74 m)   Wt 90.7 kg   SpO2 100%   BMI 29.97 kg/m   Physical Exam  (all labs ordered are listed, but only abnormal results are displayed) Labs Reviewed  URINALYSIS, ROUTINE W REFLEX MICROSCOPIC - Abnormal; Notable for the following components:      Result Value   Glucose, UA 250 (*)    All other components within normal limits  COMPREHENSIVE METABOLIC PANEL WITH GFR - Abnormal; Notable for the following components:   Glucose, Bld 197 (*)    All other components within normal limits  CBC WITH DIFFERENTIAL/PLATELET  LIPASE,  BLOOD    EKG: None  Radiology: CT ABDOMEN PELVIS W CONTRAST Result Date: 08/08/2024 EXAM: CT ABDOMEN AND PELVIS WITH CONTRAST 08/08/2024 12:52:27 PM TECHNIQUE: CT of the abdomen and pelvis was performed with the administration of 100 mL of iohexol  (OMNIPAQUE ) 300 MG/ML solution. Multiplanar reformatted images are provided for review. Automated exposure control, iterative reconstruction, and/or weight-based adjustment of the mA/kV was utilized to reduce the radiation dose to as low as reasonably achievable. COMPARISON: CT abdomen/pelvis dated 11/28/2023. CLINICAL HISTORY: LLQ abdominal pain. FINDINGS: LOWER CHEST: No acute abnormality. LIVER: The liver is unremarkable. GALLBLADDER AND BILE DUCTS: Gallbladder is unremarkable. No biliary ductal dilatation. SPLEEN: Unremarkable. PANCREAS: Unremarkable. ADRENAL GLANDS: Unremarkable. KIDNEYS, URETERS AND BLADDER: No stones in the kidneys or ureters. No hydronephrosis. No perinephric or periureteral stranding. Urinary bladder is unremarkable. GI AND BOWEL: Stomach demonstrates no acute abnormality. Appendix appears normal. There is no bowel obstruction. Sigmoid colonic diverticulosis without evidence of acute diverticulitis. PERITONEUM AND RETROPERITONEUM: No ascites. No free air. VASCULATURE: Aorta is normal in caliber. LYMPH NODES: No lymphadenopathy. REPRODUCTIVE ORGANS: No acute abnormality. BONES AND SOFT TISSUES: Small fat-containing left inguinal hernia, unchanged . No acute osseous abnormality. No suspicious osseous lesion. IMPRESSION: 1. No acute localizing findings in the abdomen or pelvis. 2. Sigmoid diverticulosis without evidence of acute diverticulitis. 3. Unchanged small fat-containing left inguinal hernia. Electronically signed by: [employer]  MD 08/08/2024 02:21 PM EDT RP Workstation: [id number]    Procedures   Medications Ordered in the ED  ondansetron  (ZOFRAN ) injection 4 mg (4 mg Intravenous Patient Refused/Not Given 08/08/24 1432)   acetaminophen  (TYLENOL ) tablet 1,000 mg (1,000 mg Oral Given 08/08/24 1108)  iohexol  (OMNIPAQUE ) 300 MG/ML solution 100 mL (100 mLs Intravenous Contrast Given 08/08/24 1241)                                 Medical Decision Making Amount and/or Complexity of Data Reviewed Labs: ordered.  Risk OTC drugs. Prescription drug management.   This patient presents to the ED for concern of abdominal pain, this involves an extensive number of treatment options, and is a complaint that carries with it a high risk of complications and morbidity.  Differential diagnosis includes: Mesenteric ischemia, diverticulitis, diverticulosis, appendicitis, IBD, IBS, cholecystitis, pancreatitis  Co morbidities:  history of diverticulitis   Lab Tests:  I Ordered, and personally interpreted labs.  The pertinent results include: Normal WBC.  Normal lipase.  Urine not infected.  No abnormalities to explain patient's symptoms.  Imaging Studies:  I ordered imaging studies including CT abdomen pelvis I independently visualized and interpreted imaging which showed diverticulosis without evidence of diverticulitis or perforation. I agree with the radiologist interpretation  Cardiac Monitoring/ECG:  The patient was maintained on a cardiac monitor.  I personally viewed and interpreted the cardiac monitored which showed an underlying rhythm of: Normal sinus  Medicines ordered and prescription drug management:  I ordered medication including  Medications  ondansetron  (ZOFRAN ) injection 4 mg (4 mg Intravenous Patient Refused/Not Given 08/08/24 1432)  acetaminophen  (TYLENOL ) tablet 1,000 mg (1,000 mg Oral Given 08/08/24 1108)  iohexol  (OMNIPAQUE ) 300 MG/ML solution 100 mL (100 mLs Intravenous Contrast Given 08/08/24 1241)   for pain management Reevaluation of the patient after these medicines showed that the patient improved I have reviewed the patients home medicines and have made adjustments as  needed  Test Considered:   none  Critical Interventions:   none  Consultations Obtained: None  Problem List / ED Course:     ICD-10-CM   1. Diverticulosis  K57.90       MDM: 53 year old male presents the emergency department for evaluation of abdominal pain.  He reports left lower quadrant pain for 2 days as well as nausea.  He reports positive history for diverticulitis and states he feels like it is starting to flare up.  No leukocytosis.  No fever or systemic symptoms.  CMP shows no acute abnormality or signs of infection. Urine not infected.  CT shows evidence of diverticulosis without diverticulitis or perforation.  Given lack of systemic symptoms, supportive care is recommended management of patient's pain.  This includes bowel rest, clear liquid diet and pain control with Tylenol .  No indication for antibiotics at this time.  Patient educated on symptom management and return precautions.  Patient agreeable with plan.  Vital signs stable.  Patient stable for discharge at this time.   Dispostion:  After consideration of the diagnostic results and the patients response to treatment, I feel that the patient would benefit from supportive management including bowel rest.   Final diagnoses:  Diverticulosis    ED Discharge Orders     None          Torrence Marry RAMAN, PA-C 08/08/24 1451    Elnor Savant A, DO 08/14/24 1310

## 2024-08-08 NOTE — ED Notes (Signed)
 ED Provider at bedside.

## 2024-08-08 NOTE — ED Triage Notes (Signed)
LLQ pain x 2 days, with nausea

## 2024-10-13 ENCOUNTER — Ambulatory Visit (INDEPENDENT_AMBULATORY_CARE_PROVIDER_SITE_OTHER)

## 2024-10-13 ENCOUNTER — Encounter (HOSPITAL_BASED_OUTPATIENT_CLINIC_OR_DEPARTMENT_OTHER): Payer: Self-pay

## 2024-10-13 VITALS — BP 118/77 | HR 92 | Ht 68.5 in | Wt 206.7 lb

## 2024-10-13 DIAGNOSIS — K5792 Diverticulitis of intestine, part unspecified, without perforation or abscess without bleeding: Secondary | ICD-10-CM | POA: Diagnosis not present

## 2024-10-13 DIAGNOSIS — E119 Type 2 diabetes mellitus without complications: Secondary | ICD-10-CM

## 2024-10-13 DIAGNOSIS — Z7689 Persons encountering health services in other specified circumstances: Secondary | ICD-10-CM

## 2024-10-13 DIAGNOSIS — Z7984 Long term (current) use of oral hypoglycemic drugs: Secondary | ICD-10-CM

## 2024-10-13 MED ORDER — METFORMIN HCL ER 500 MG PO TB24: 500.0000 mg | ORAL_TABLET | Freq: Two times a day (BID) | ORAL | 3 refills | Status: DC

## 2024-10-13 NOTE — Progress Notes (Signed)
 "   New Patient Office Visit  Subjective:   Thomas Trevino 05-26-1971 10/13/2024  Chief Complaint  Patient presents with   Transitions Of Care    Patient is here to get established with new PCP. State he has pain when is Diverticulitis flares up. Denies any other concerns for today's visit.     HPI: Thomas Trevino presents today to establish care at Primary Care and Sports Medicine at [facility]. Introduced to publishing rights manager role and practice setting with verbalized understanding by patient.  All questions answered.   Last PCP: unsure Last annual physical: unsure Concerns: See below   Diverticulitis: States he has flare ups which he tries to get ahead of early. Was told in most recent ER visit (07/2024) that his CT abdomen was clear. States he has felt relief after most recent flare. Denies current abdominal pain, NVD, and/or fever.   T2DM: States he thinks he is well controlled however his previous provider mentioned a shot they wanted him to start. Pt states he does not want to do that and would like to stay on the metformin . Pt is requesting a refill on his metformin  as he is almost out.   The following portions of the patient's history were reviewed and updated as appropriate: past medical history, past surgical history, family history, social history, allergies, medications, and problem list.   Patient Active Problem List   Diagnosis Date Noted   Type 2 diabetes mellitus (HCC) 10/26/2022   Psoriasis 01/16/2022   Past Medical History:  Diagnosis Date   Diabetes mellitus without complication (HCC)    Diverticulitis    Genital herpes    History reviewed. No pertinent surgical history. Family History  Problem Relation Age of Onset   Heart disease Mother    Diabetes Mother    Testicular cancer Father    Diabetes Father    Social History   Socioeconomic History   Marital status: Single    Spouse name: Not on file   Number of children: Not  on file   Years of education: Not on file   Highest education level: Not on file  Occupational History   Not on file  Tobacco Use   Smoking status: Every Day    Current packs/day: 0.20    Types: Cigarettes   Smokeless tobacco: Never  Vaping Use   Vaping status: Never Used  Substance and Sexual Activity   Alcohol use: Yes    Comment: occ   Drug use: No   Sexual activity: Not on file  Other Topics Concern   Not on file  Social History Narrative   Not on file   Social Drivers of Health   Tobacco Use: High Risk (10/13/2024)   Patient History    Smoking Tobacco Use: Every Day    Smokeless Tobacco Use: Never    Passive Exposure: Not on file  Financial Resource Strain: Low Risk (10/13/2024)   Overall Financial Resource Strain (CARDIA)    Difficulty of Paying Living Expenses: Not hard at all  Food Insecurity: No Food Insecurity (10/13/2024)   Epic    Worried About Radiation Protection Practitioner of Food in the Last Year: Never true    Ran Out of Food in the Last Year: Never true  Transportation Needs: No Transportation Needs (10/13/2024)   Epic    Lack of Transportation (Medical): No    Lack of Transportation (Non-Medical): No  Physical Activity: Inactive (10/13/2024)   Exercise Vital Sign    Days of Exercise per  Week: 0 days    Minutes of Exercise per Session: 0 min  Stress: No Stress Concern Present (10/13/2024)   [employer] of Occupational Health - Occupational Stress Questionnaire    Feeling of Stress: Not at all  Social Connections: Moderately Integrated (10/13/2024)   Social Connection and Isolation Panel    Frequency of Communication with Friends and Family: Three times a week    Frequency of Social Gatherings with Friends and Family: Once a week    Attends Religious Services: More than 4 times per year    Active Member of Clubs or Organizations: Yes    Attends Banker Meetings: More than 4 times per year    Marital Status: Never married  Intimate Partner  Violence: Not At Risk (10/13/2024)   Epic    Fear of Current or Ex-Partner: No    Emotionally Abused: No    Physically Abused: No    Sexually Abused: No  Depression (PHQ2-9): Low Risk (01/08/2024)   Depression (PHQ2-9)    PHQ-2 Score: 0  Alcohol Screen: Low Risk (10/13/2024)   Alcohol Screen    Last Alcohol Screening Score (AUDIT): 1  Housing: Low Risk (10/13/2024)   Epic    Unable to Pay for Housing in the Last Year: No    Number of Times Moved in the Last Year: 0    Homeless in the Last Year: No  Utilities: Not At Risk (10/13/2024)   Epic    Threatened with loss of utilities: No  Health Literacy: Adequate Health Literacy (10/13/2024)   B1300 Health Literacy    Frequency of need for help with medical instructions: Never   Outpatient Medications Prior to Visit  Medication Sig Dispense Refill   ibuprofen  (ADVIL ,MOTRIN ) 600 MG tablet Take 1 tablet (600 mg total) by mouth every 6 (six) hours as needed. 30 tablet 0   metFORMIN  (GLUCOPHAGE -XR) 500 MG 24 hr tablet TAKE 1 TABLET BY MOUTH TWICE A DAY WITH FOOD 30 tablet 0   triamcinolone  ointment (KENALOG ) 0.1 % Apply 1 Application topically 2 (two) times daily. (Patient not taking: Reported on 10/13/2024)     No facility-administered medications prior to visit.   Allergies[1]  ROS: A complete ROS was performed with pertinent positives/negatives noted in the HPI. The remainder of the ROS are negative.   Objective:   Today's Vitals   10/13/24 1402  BP: 118/77  Pulse: 92  SpO2: 97%  Weight: 206 lb 11.2 oz (93.8 kg)  Height: 5' 8.5 (1.74 m)    GENERAL: Well-appearing, in NAD. Well nourished. RESPIRATORY: Chest wall symmetrical. Respirations even and non-labored. Breath sounds clear to auscultation bilaterally.  CARDIAC: S1, S2 present, regular rate and rhythm without murmur or gallops. Peripheral pulses 2+ bilaterally.  MSK: Muscle tone and strength appropriate for age. Joints w/o tenderness, redness, or swelling.   PSYCH/MENTAL STATUS: Alert, oriented x 3. Cooperative, appropriate mood and affect.    Health Maintenance Due  Topic Date Due   FOOT EXAM  Never done   OPHTHALMOLOGY EXAM  Never done   HIV Screening  Never done   Diabetic kidney evaluation - Urine ACR  Never done   Hepatitis C Screening  Never done   DTaP/Tdap/Td (1 - Tdap) Never done   Pneumococcal Vaccine: 50+ Years (1 of 2 - PCV) Never done   Hepatitis B Vaccines 19-59 Average Risk (1 of 3 - 19+ 3-dose series) Never done   Colonoscopy  Never done   Zoster Vaccines- Shingrix (1 of 2) Never  done   COVID-19 Vaccine (1 - 2025-26 season) Never done   HEMOGLOBIN A1C  07/10/2024    No results found for any visits on 10/13/24.     Assessment & Plan:  1. Encounter to establish care with new doctor (Primary) Discussed role of NP and expectations of the Primary Care Clinic. Discussed medical, surgical, and family history.   2. Type 2 diabetes mellitus without complication, without long-term current use of insulin (HCC) Discussed obtaining labs at his physical in approximately 1 month. Discussed potentially having to change his regimen depending on what his labs show. Discussed importance of diet and exercise again to help control his T2DM.  - metFORMIN  (GLUCOPHAGE -XR) 500 MG 24 hr tablet; Take 1 tablet (500 mg total) by mouth 2 (two) times daily with a meal.  Dispense: 60 tablet; Refill: 3  3. Diverticulitis Discussed continuing being aware of his food choices and triggers. Discussed with the patient the importance of seeking emergency care if vomiting becomes uncontrolled especially with associated fevers or unrelenting pain. Pt verbalized understanding.    Patient to reach out to office if new, worrisome, or unresolved symptoms arise or if no improvement in patient's condition. Patient verbalized understanding and is agreeable to treatment plan. All questions answered to patient's satisfaction.    Return in about 1 month (around  11/13/2024) for annual physical (fasting labs day of).    Lauraine Almarie Angus DNP, FNP-C      [1] No Known Allergies  "

## 2024-11-13 ENCOUNTER — Ambulatory Visit (INDEPENDENT_AMBULATORY_CARE_PROVIDER_SITE_OTHER)

## 2024-11-13 ENCOUNTER — Encounter (HOSPITAL_BASED_OUTPATIENT_CLINIC_OR_DEPARTMENT_OTHER): Payer: Self-pay

## 2024-11-13 VITALS — BP 119/86 | HR 82 | Ht 68.5 in | Wt 203.9 lb

## 2024-11-13 DIAGNOSIS — Z0000 Encounter for general adult medical examination without abnormal findings: Secondary | ICD-10-CM

## 2024-11-13 DIAGNOSIS — K219 Gastro-esophageal reflux disease without esophagitis: Secondary | ICD-10-CM

## 2024-11-13 DIAGNOSIS — E119 Type 2 diabetes mellitus without complications: Secondary | ICD-10-CM

## 2024-11-13 DIAGNOSIS — Z23 Encounter for immunization: Secondary | ICD-10-CM

## 2024-11-13 DIAGNOSIS — Z794 Long term (current) use of insulin: Secondary | ICD-10-CM

## 2024-11-13 MED ORDER — BLOOD GLUCOSE TEST VI STRP: 1.0000 | ORAL_STRIP | 1 refills | Status: AC

## 2024-11-13 MED ORDER — PANTOPRAZOLE SODIUM 20 MG PO TBEC: 20.0000 mg | DELAYED_RELEASE_TABLET | Freq: Every day | ORAL | 1 refills | Status: AC

## 2024-11-13 NOTE — Progress Notes (Signed)
 "  Subjective:   Thomas Trevino April 22, 1971 11/13/2024  CC: Chief Complaint  Patient presents with   Annual Exam   HPI: Thomas Trevino is a 54 y.o. male who presents for a routine health maintenance exam.  Labs collected at time of visit.   DIABETES MELLITUS: Thomas Trevino presents for the medical management of diabetes.  Current diabetes medication regimen: Metformin  500mg  daily - states he has been taking one and a half pills at night Patient is adhering to a diabetic diet.  Patient is exercising regularly.  Patient is checking BS regularly. Avg: 130-170 Patient is checking their feet regularly.  Denies polydipsia, polyphagia, polyuria, open wounds or ulcers on feet.  Lab Results  Component Value Date   HGBA1C 10.5 (H) 01/08/2024    Wt Readings from Last 3 Encounters:  11/13/24 203 lb 14.4 oz (92.5 kg)  10/13/24 206 lb 11.2 oz (93.8 kg)  08/08/24 200 lb (90.7 kg)   DM Foot Exam Color: normal Sensation Monofilament:normal right, left, and bilateral dorsal, plantar, toe, and distal Sensation Vibration:normal right, left, and bilateral dorsal, plantar, toe, and distal Circulation: Pulses normal right, left, and bilateral pedal and posterial tibial  Lesions: normal dorsal, plantar, toe, and distal   UPPER EPIGASTRIC PAIN: States it has been intermittent. States pain 6/7 out of 10. States it is depending on what he eats. States he feels like he notices it more with sauce.   HEALTH SCREENINGS: - Vision Screening: up to date - Dental Visits: up to date - Testicular Exam: Declined - STD Screening: Not applicable - PSA (50+): Ordered today   Lab Results  Component Value Date   PSA 1.10 01/08/2024   PSA 0.98 10/26/2022     - Colonoscopy (45+): Up to date  Discussed with patient purpose of the colonoscopy is to detect colon cancer at curable precancerous or early stages  - AAA Screening: Not applicable  Men age 56-75 who have ever smoked - Lung Cancer screening  with low-dose CT: Declined-  Adults age 38-80 who are current cigarette smokers or quit within the last 15 years. Must have 20 pack year history.   Depression and Anxiety Screen done today and results listed below:     11/13/2024    9:51 AM 01/08/2024    3:42 PM 10/26/2022    3:00 PM  Depression screen PHQ 2/9  Decreased Interest 0 0 0  Down, Depressed, Hopeless 0 0 0  PHQ - 2 Score 0 0 0  Altered sleeping 0    Tired, decreased energy 3    Change in appetite 0    Feeling bad or failure about yourself  0    Trouble concentrating 0    Moving slowly or fidgety/restless 0    Suicidal thoughts 0    PHQ-9 Score 3       IMMUNIZATIONS:  - Tdap: Tetanus vaccination status reviewed: Td vaccination indicated and given today. - Influenza: Refused - Pneumovax: Refused - Prevnar: Refused - Shingrix vaccine (50+): Refused   Past medical history, surgical history, medications, allergies, family history and social history reviewed with patient today and changes made to appropriate areas of the chart.   Past Medical History:  Diagnosis Date   Diabetes mellitus without complication (HCC)    Diverticulitis    Genital herpes     History reviewed. No pertinent surgical history.  Current Outpatient Medications on File Prior to Visit  Medication Sig   ibuprofen  (ADVIL ,MOTRIN ) 600 MG tablet Take 1 tablet (600 mg total)  by mouth every 6 (six) hours as needed.   metFORMIN  (GLUCOPHAGE -XR) 500 MG 24 hr tablet Take 1 tablet (500 mg total) by mouth 2 (two) times daily with a meal.   No current facility-administered medications on file prior to visit.    Allergies[1]   Social History   Socioeconomic History   Marital status: Single    Spouse name: Not on file   Number of children: Not on file   Years of education: Not on file   Highest education level: Not on file  Occupational History   Not on file  Tobacco Use   Smoking status: Former    Current packs/day: 0.20    Types: Cigarettes    Smokeless tobacco: Never   Tobacco comments:    Pt states he quit 10/23/24  Vaping Use   Vaping status: Never Used  Substance and Sexual Activity   Alcohol use: Yes    Comment: occ   Drug use: No   Sexual activity: Not on file  Other Topics Concern   Not on file  Social History Narrative   Not on file   Social Drivers of Health   Tobacco Use: Medium Risk (11/13/2024)   Patient History    Smoking Tobacco Use: Former    Smokeless Tobacco Use: Never    Passive Exposure: Not on file  Financial Resource Strain: Low Risk (10/13/2024)   Overall Financial Resource Strain (CARDIA)    Difficulty of Paying Living Expenses: Not hard at all  Food Insecurity: No Food Insecurity (10/13/2024)   Epic    Worried About Radiation Protection Practitioner of Food in the Last Year: Never true    Ran Out of Food in the Last Year: Never true  Transportation Needs: No Transportation Needs (10/13/2024)   Epic    Lack of Transportation (Medical): No    Lack of Transportation (Non-Medical): No  Physical Activity: Inactive (10/13/2024)   Exercise Vital Sign    Days of Exercise per Week: 0 days    Minutes of Exercise per Session: 0 min  Stress: No Stress Concern Present (10/13/2024)   [employer] of Occupational Health - Occupational Stress Questionnaire    Feeling of Stress: Not at all  Social Connections: Moderately Integrated (10/13/2024)   Social Connection and Isolation Panel    Frequency of Communication with Friends and Family: Three times a week    Frequency of Social Gatherings with Friends and Family: Once a week    Attends Religious Services: More than 4 times per year    Active Member of Clubs or Organizations: Yes    Attends Banker Meetings: More than 4 times per year    Marital Status: Never married  Intimate Partner Violence: Not At Risk (10/13/2024)   Epic    Fear of Current or Ex-Partner: No    Emotionally Abused: No    Physically Abused: No    Sexually Abused: No  Depression  (PHQ2-9): Low Risk (11/13/2024)   Depression (PHQ2-9)    PHQ-2 Score: 3  Alcohol Screen: Low Risk (10/13/2024)   Alcohol Screen    Last Alcohol Screening Score (AUDIT): 1  Housing: Low Risk (10/13/2024)   Epic    Unable to Pay for Housing in the Last Year: No    Number of Times Moved in the Last Year: 0    Homeless in the Last Year: No  Utilities: Not At Risk (10/13/2024)   Epic    Threatened with loss of utilities: No  Health Literacy: Adequate Health  Literacy (10/13/2024)   B1300 Health Literacy    Frequency of need for help with medical instructions: Never   Tobacco Use History[2] Social History   Substance and Sexual Activity  Alcohol Use Yes   Comment: occ     Family History  Problem Relation Age of Onset   Heart disease Mother    Diabetes Mother    Testicular cancer Father    Diabetes Father      ROS: Denies fever, fatigue, unexplained weight loss/gain, CP, SHOB, and palpatitations. Denies neurological deficits, gastrointestinal and/or genitourinary complaints, and skin changes.   Objective:   Today's Vitals   11/13/24 0854  BP: 119/86  Pulse: 82  SpO2: 99%  Weight: 203 lb 14.4 oz (92.5 kg)  Height: 5' 8.5 (1.74 m)  PainSc: 0-No pain    GENERAL APPEARANCE: Well-appearing, in NAD. Well nourished.  SKIN: Pink, warm and dry. Turgor normal. No rash, lesion, ulceration, or ecchymoses. Hair evenly distributed.  HEENT: HEAD: Normocephalic.  EYES: PERRLA. EOMI. Lids intact w/o defect. Sclera white, Conjunctiva pink w/o exudate.  EARS: External ear w/o redness, swelling, masses or lesions. EAC clear. TM's intact, translucent w/o bulging, appropriate landmarks visualized. Appropriate acuity to conversational tones.  NOSE: Septum midline w/o deformity. Nares patent, mucosa pink and non-inflamed w/o drainage. No sinus tenderness.  THROAT: Uvula midline. Oropharynx clear. Tonsils non-inflamed w/o exudate. Oral mucosa pink and moist.  NECK: Supple, Trachea midline.  Full ROM w/o pain or tenderness. No lymphadenopathy. Thyroid non-tender w/o enlargement or palpable masses.  RESPIRATORY: Chest wall symmetrical w/o masses. Respirations even and non-labored. Breath sounds clear to auscultation bilaterally. No wheezes, rales, rhonchi, or crackles. CARDIAC: S1, S2 present, regular rate and rhythm. No gallops, murmurs, rubs, or clicks. PMI w/o lifts, heaves, or thrills. No carotid bruits. Capillary refill <2 seconds. Peripheral pulses 2+ bilaterally. GI: Abdomen soft w/o distention. Normoactive bowel sounds. No palpable masses or tenderness. No guarding or rebound tenderness. Liver and spleen w/o tenderness or enlargement. No CVA tenderness.  GU: Pt deferred exam. MSK: Muscle tone and strength appropriate for age, w/o atrophy or abnormal movement. EXTREMITIES: Active ROM intact, w/o tenderness, crepitus, or contracture. No obvious joint deformities or effusions. No clubbing, edema, or cyanosis.  NEUROLOGIC: CN's II-XII intact. Motor strength symmetrical with no obvious weakness. No sensory deficits. DTR 2+ symmetric bilaterally. Steady, even gait.  PSYCH/MENTAL STATUS: Alert, oriented x 3. Cooperative, appropriate mood and affect.    Assessment & Plan:  1. Annual physical exam (Primary) Discussed preventative screenings, vaccines, lab work, and healthy lifestyle with patient.  - CBC with Differential/Platelet - Comprehensive metabolic panel with GFR - Hemoglobin A1c - TSH - Lipid panel - PSA  2. Gastroesophageal reflux disease without esophagitis Discussed close follow up with patient regarding symptoms. Discussed keeping a log of foods that aggravate his symptoms. Discussed to follow up with PCP if medication is helpful for him. I discussed when to be seen by ER or PCP. - pantoprazole  (PROTONIX ) 20 MG tablet; Take 1 tablet (20 mg total) by mouth daily.  Dispense: 30 tablet; Refill: 1  3. Type 2 diabetes mellitus without complication, with long-term current  use of insulin (HCC) Labs ordered today. Discussed medication adjustment depending on what his A1C results as. Discussed proper administration of medication as ordered. Pt verbalized understanding. - Glucose Blood (BLOOD GLUCOSE TEST STRIPS) STRP; 1 each by Does not apply route as directed. Dispense based on patient and insurance preference. Use up to four times daily as directed. (FOR ICD-10 E10.9,  E11.9).  Dispense: 100 strip; Refill: 1 - Microalbumin / creatinine urine ratio  4. Encounter for immunization Given at today's visit. Denies any previous immunization problems.  - Tdap vaccine greater than or equal to 7yo IM   PATIENT COUNSELING: - Encouraged to adjust caloric intake to maintain or achieve ideal body weight, to reduce intake of dietary saturated fat and total fat, to limit sodium intake by avoiding high sodium foods and not adding table salt, and to maintain adequate dietary potassium and calcium  preferably from fresh fruits, vegetables, and low-fat dairy products.   - Advised to avoid cigarette smoking. - Discussed with the patient that most people either abstain from alcohol or drink within safe limits (<=14/week and <=4 drinks/occasion for males, <=7/weeks and <= 3 drinks/occasion for females) and that the risk for alcohol disorders and other health effects rises proportionally with the number of drinks per week and how often a drinker exceeds daily limits. - Discussed cessation/primary prevention of drug use and availability of treatment for abuse.  - Stressed the importance of regular exercise - Injury prevention: Discussed safety belts, safety helmets, smoke detector, smoking near bedding or upholstery.  - Dental health: Discussed importance of regular tooth brushing, flossing, and dental visits.  - Sexuality: Discussed sexually transmitted diseases, partner selection, use of condoms, avoidance of unintended pregnancy  and contraceptive alternatives.   NEXT PREVENTATIVE  PHYSICAL DUE IN 1 YEAR.  Return in about 3 months (around 02/11/2025) for DM.  Patient to reach out to office if new, worrisome, or unresolved symptoms arise or if no improvement in patient's condition. Patient verbalized understanding and is agreeable to treatment plan. All questions answered to patient's satisfaction.    Lauraine Almarie Angus DNP, FNP-C     [1] No Known Allergies [2]  Social History Tobacco Use  Smoking Status Former   Current packs/day: 0.20   Types: Cigarettes  Smokeless Tobacco Never  Tobacco Comments   Pt states he quit 10/23/24   "

## 2024-11-14 LAB — CBC WITH DIFFERENTIAL/PLATELET
Basophils Absolute: 0 x10E3/uL (ref 0.0–0.2)
Basos: 1 %
EOS (ABSOLUTE): 0.2 x10E3/uL (ref 0.0–0.4)
Eos: 3 %
Hematocrit: 46.6 % (ref 37.5–51.0)
Hemoglobin: 15.6 g/dL (ref 13.0–17.7)
Immature Grans (Abs): 0 x10E3/uL (ref 0.0–0.1)
Immature Granulocytes: 0 %
Lymphocytes Absolute: 2.8 x10E3/uL (ref 0.7–3.1)
Lymphs: 38 %
MCH: 30.6 pg (ref 26.6–33.0)
MCHC: 33.5 g/dL (ref 31.5–35.7)
MCV: 92 fL (ref 79–97)
Monocytes Absolute: 0.5 x10E3/uL (ref 0.1–0.9)
Monocytes: 6 %
Neutrophils Absolute: 4 x10E3/uL (ref 1.4–7.0)
Neutrophils: 52 %
Platelets: 260 x10E3/uL (ref 150–450)
RBC: 5.09 x10E6/uL (ref 4.14–5.80)
RDW: 12.4 % (ref 11.6–15.4)
WBC: 7.6 x10E3/uL (ref 3.4–10.8)

## 2024-11-14 LAB — COMPREHENSIVE METABOLIC PANEL WITH GFR
ALT: 29 IU/L (ref 0–44)
AST: 21 IU/L (ref 0–40)
Albumin: 4.5 g/dL (ref 3.8–4.9)
Alkaline Phosphatase: 77 IU/L (ref 47–123)
BUN/Creatinine Ratio: 10 (ref 9–20)
BUN: 11 mg/dL (ref 6–24)
Bilirubin Total: 0.3 mg/dL (ref 0.0–1.2)
CO2: 20 mmol/L (ref 20–29)
Calcium: 9.6 mg/dL (ref 8.7–10.2)
Chloride: 100 mmol/L (ref 96–106)
Creatinine, Ser: 1.07 mg/dL (ref 0.76–1.27)
Globulin, Total: 2.5 g/dL (ref 1.5–4.5)
Glucose: 159 mg/dL — ABNORMAL HIGH (ref 70–99)
Potassium: 4.8 mmol/L (ref 3.5–5.2)
Sodium: 136 mmol/L (ref 134–144)
Total Protein: 7 g/dL (ref 6.0–8.5)
eGFR: 82 mL/min/1.73

## 2024-11-14 LAB — TSH: TSH: 1.26 u[IU]/mL (ref 0.450–4.500)

## 2024-11-14 LAB — PSA: Prostate Specific Ag, Serum: 1.1 ng/mL (ref 0.0–4.0)

## 2024-11-14 LAB — LIPID PANEL
Chol/HDL Ratio: 4.8 ratio (ref 0.0–5.0)
Cholesterol, Total: 157 mg/dL (ref 100–199)
HDL: 33 mg/dL — ABNORMAL LOW
LDL Chol Calc (NIH): 107 mg/dL — ABNORMAL HIGH (ref 0–99)
Triglycerides: 92 mg/dL (ref 0–149)
VLDL Cholesterol Cal: 17 mg/dL (ref 5–40)

## 2024-11-14 LAB — HEMOGLOBIN A1C
Est. average glucose Bld gHb Est-mCnc: 237 mg/dL
Hgb A1c MFr Bld: 9.9 % — ABNORMAL HIGH (ref 4.8–5.6)

## 2024-11-14 LAB — MICROALBUMIN / CREATININE URINE RATIO
Creatinine, Urine: 181.9 mg/dL
Microalb/Creat Ratio: 17 mg/g{creat} (ref 0–29)
Microalbumin, Urine: 31.2 ug/mL

## 2024-11-17 ENCOUNTER — Ambulatory Visit (HOSPITAL_BASED_OUTPATIENT_CLINIC_OR_DEPARTMENT_OTHER): Payer: Self-pay

## 2024-11-17 DIAGNOSIS — E119 Type 2 diabetes mellitus without complications: Secondary | ICD-10-CM

## 2024-11-17 MED ORDER — METFORMIN HCL 500 MG PO TABS: 1000.0000 mg | ORAL_TABLET | Freq: Two times a day (BID) | ORAL | 1 refills | Status: AC

## 2024-11-17 NOTE — Progress Notes (Signed)
 Hi Thomas Trevino, In looking over your lab work - your kidney and liver function are all normal. Your blood counts, thyroid, PSA and urine also look good! Your A1C is down but still elevated. Since you are already taking more than the 500mg  dose I am upping your dosage to 2 pills (total 1000mg ) at breakfast and at dinner. So you will take 2 pills twice a day. Please do not adjust the dosage on your own as I would like to see where this brings your A1C to in April as it is a 3 month average. If you have any questions please don't hesitate to reach out!   Lauraine Almarie Angus DNP, FNP-C

## 2025-02-11 ENCOUNTER — Ambulatory Visit (HOSPITAL_BASED_OUTPATIENT_CLINIC_OR_DEPARTMENT_OTHER)
